# Patient Record
Sex: Female | Born: 1955 | Race: White | Hispanic: No | Marital: Married | Smoking: Former smoker
Health system: Southern US, Community
[De-identification: ages and names within clinical notes are randomized; demographics above are authoritative.]

## PROBLEM LIST (undated history)

## (undated) DIAGNOSIS — M549 Dorsalgia, unspecified: Secondary | ICD-10-CM

## (undated) HISTORY — PX: TUBAL LIGATION: SHX77

## (undated) HISTORY — PX: APPENDECTOMY: SHX54

## (undated) HISTORY — PX: OVARIAN CYST REMOVAL: SHX89

---

## 1997-04-23 HISTORY — PX: LUMBAR FUSION: SHX111

## 1997-07-28 ENCOUNTER — Ambulatory Visit (HOSPITAL_COMMUNITY): Admission: RE | Admit: 1997-07-28 | Discharge: 1997-07-28 | Payer: Self-pay | Admitting: Neurosurgery

## 1997-08-17 ENCOUNTER — Inpatient Hospital Stay (HOSPITAL_COMMUNITY): Admission: RE | Admit: 1997-08-17 | Discharge: 1997-08-21 | Payer: Self-pay | Admitting: Neurosurgery

## 1997-09-21 ENCOUNTER — Emergency Department (HOSPITAL_COMMUNITY): Admission: EM | Admit: 1997-09-21 | Discharge: 1997-09-21 | Payer: Self-pay | Admitting: Emergency Medicine

## 1998-02-02 ENCOUNTER — Encounter: Admission: RE | Admit: 1998-02-02 | Discharge: 1998-05-03 | Payer: Self-pay | Admitting: Neurosurgery

## 1998-08-27 ENCOUNTER — Encounter: Payer: Self-pay | Admitting: Emergency Medicine

## 1998-08-27 ENCOUNTER — Emergency Department (HOSPITAL_COMMUNITY): Admission: EM | Admit: 1998-08-27 | Discharge: 1998-08-27 | Payer: Self-pay | Admitting: Emergency Medicine

## 1999-03-30 ENCOUNTER — Encounter: Payer: Self-pay | Admitting: Neurosurgery

## 1999-03-30 ENCOUNTER — Ambulatory Visit (HOSPITAL_COMMUNITY): Admission: RE | Admit: 1999-03-30 | Discharge: 1999-03-30 | Payer: Self-pay | Admitting: Neurosurgery

## 1999-05-01 ENCOUNTER — Encounter: Admission: RE | Admit: 1999-05-01 | Discharge: 1999-05-01 | Payer: Self-pay | Admitting: Neurosurgery

## 1999-05-01 ENCOUNTER — Encounter: Payer: Self-pay | Admitting: Neurosurgery

## 1999-07-14 ENCOUNTER — Encounter: Payer: Self-pay | Admitting: Neurosurgery

## 1999-07-18 ENCOUNTER — Encounter: Payer: Self-pay | Admitting: Neurosurgery

## 1999-07-18 ENCOUNTER — Inpatient Hospital Stay (HOSPITAL_COMMUNITY): Admission: RE | Admit: 1999-07-18 | Discharge: 1999-07-25 | Payer: Self-pay | Admitting: Neurosurgery

## 1999-08-14 ENCOUNTER — Encounter: Admission: RE | Admit: 1999-08-14 | Discharge: 1999-08-14 | Payer: Self-pay | Admitting: Neurosurgery

## 1999-08-14 ENCOUNTER — Encounter: Payer: Self-pay | Admitting: Neurosurgery

## 1999-10-06 ENCOUNTER — Encounter: Payer: Self-pay | Admitting: Neurosurgery

## 1999-10-06 ENCOUNTER — Encounter: Admission: RE | Admit: 1999-10-06 | Discharge: 1999-10-06 | Payer: Self-pay | Admitting: Neurosurgery

## 2001-01-17 ENCOUNTER — Ambulatory Visit (HOSPITAL_COMMUNITY): Admission: RE | Admit: 2001-01-17 | Discharge: 2001-01-17 | Payer: Self-pay | Admitting: Neurosurgery

## 2001-01-17 ENCOUNTER — Encounter: Payer: Self-pay | Admitting: Neurosurgery

## 2003-11-08 ENCOUNTER — Other Ambulatory Visit: Admission: RE | Admit: 2003-11-08 | Discharge: 2003-11-08 | Payer: Self-pay | Admitting: *Deleted

## 2006-10-09 ENCOUNTER — Emergency Department (HOSPITAL_COMMUNITY): Admission: EM | Admit: 2006-10-09 | Discharge: 2006-10-09 | Payer: Self-pay | Admitting: Family Medicine

## 2006-10-09 ENCOUNTER — Ambulatory Visit (HOSPITAL_COMMUNITY): Admission: RE | Admit: 2006-10-09 | Discharge: 2006-10-09 | Payer: Self-pay | Admitting: Family Medicine

## 2007-01-31 ENCOUNTER — Ambulatory Visit (HOSPITAL_COMMUNITY): Admission: RE | Admit: 2007-01-31 | Discharge: 2007-01-31 | Payer: Self-pay | Admitting: Surgery

## 2007-01-31 ENCOUNTER — Encounter (INDEPENDENT_AMBULATORY_CARE_PROVIDER_SITE_OTHER): Payer: Self-pay | Admitting: Surgery

## 2007-03-29 ENCOUNTER — Encounter: Admission: RE | Admit: 2007-03-29 | Discharge: 2007-03-29 | Payer: Self-pay | Admitting: Neurosurgery

## 2010-05-14 ENCOUNTER — Encounter: Payer: Self-pay | Admitting: Neurosurgery

## 2010-09-05 NOTE — Op Note (Signed)
NAMEJASENIA, [name]                ACCOUNT NO.:  [id number]   MEDICAL RECORD NO.:  [id number]          PATIENT TYPE:  AMB   LOCATION:  DAY                          FACILITY:  [facility]   PHYSICIAN:  Wilmon Arms. Corliss Skains, M.D. DATE OF BIRTH:  02-05-1956   DATE OF PROCEDURE:  01/31/2007  DATE OF DISCHARGE:                               OPERATIVE REPORT   PREOPERATIVE DIAGNOSIS:  Biliary dyskinesia.   POSTOPERATIVE DIAGNOSIS:  Biliary dyskinesia.   PROCEDURE PERFORMED:  Laparoscopic cholecystectomy with intraoperative  cholangiogram.   SURGEON:  Wilmon Arms. Tsuei, M.D., FACS   ANESTHESIA:  General endotracheal.   INDICATIONS:  The patient is a 55 year old female who presents with a 7-  month history of intermittent right shoulder and right upper quadrant  pain associated with nausea and vomiting and occasional diarrhea.  These  symptoms seemed be exacerbated by eating.  An ultrasound showed no sign  of gallstones, but a small gallbladder polyp.  HIDA scan showed a  decreased gallbladder ejection fraction of only 16%.  She now presents  for elective cholecystectomy.   DESCRIPTION OF PROCEDURE:  The patient was brought to the operating  room, placed in a supine position on the operating room table.  After an  adequate level of general anesthesia was obtained, the patient's abdomen  was prepped with Betadine and draped in a sterile fashion.  A time-out  was taken to ensure the proper patient, and the proper procedure.  Her  umbilicus was infiltrated with 1/4% Marcaine.   A transverse incision was made and dissection was carried down the  fascia.  The fascia was opened vertically.  The peritoneal cavity was  bluntly entered.  A stay suture of #0 Vicryl was placed around the  fascial opening.  The Hasson cannula was inserted, and secured with a  stay suture.  A pneumoperitoneum was obtained by insufflating CO2, and  maintaining a maximum pressure of 15 mmHg.  The laparoscope was  inserted,  and the patient was rotated into reverse Trendelenburg and  tilted to her left.   A 10 mm port was placed in subxiphoid position.  Two 5 mm ports were  placed in the right upper quadrant.  The gallbladder was grasped with a  clamp and elevated over the edge of the liver.  There were some omental  adhesions to the surface of the gallbladder.  These were taken down with  scissors.  The peritoneum around the hilum of the gallbladder was  opened.  I dissected around the long, thin, cystic duct.  The cystic  duct was ligated with clips distally.  A small opening was created on  the cystic duct.  A Cook cholangiogram catheter was then inserted  through a stab incision and threaded into the cystic duct.  It was  secured with a clip.  A cholangiogram was obtained which showed a very  long, thin, cystic duct.  I had actually inserted on the left side of  the common bile duct.  The common bile duct opacified normally with no  sign of obstruction or filling defects.  Contrast flowed easily  into the  duodenum.   The catheter was then removed and the cystic duct was ligated with clips  and divided.  The cystic artery was ligated with clips and divided.  Cautery was then used to remove the gallbladder from the liver bed.  The  gallbladder was placed in an EndoCatch sac and removed out the umbilical  port site.  We reinspected the right upper quadrant for hemostasis.  There were some adhesions to the anterior surface of the liver, and  these were taken down with scissors.   The pneumoperitoneum was then released as the ports were removed under  direct vision.  Then 4-0 Vicryl was used to close the skin incisions.  Steri-Strips and clean dressings were applied.  The patient was  extubated, and brought to recovery room in stable condition.  All  sponge, instrument, and needle counts were correct.      Wilmon Arms. Tsuei, M.D.  Electronically Signed     MKT/MEDQ  D:  01/31/2007  T:  01/31/2007   Job:  [id number]   cc:   Durenda Hurt, M.D.  Fax: [phone]

## 2010-09-08 NOTE — Op Note (Signed)
. [facility]  Patient:    Laurie, Alvarado                       MRN: [id number] Proc. Date: 07/18/99 Adm. Date:  [id number] Attending:  Josie Saunders                           Operative Report  PREOPERATIVE DIAGNOSIS:  Back pain with bilateral lower extremity radiculopathy, rule out pseudoarthrosis L5-S1 (prior Ray cage fusion L5-S1).  POSTOPERATIVE DIAGNOSIS:  Back pain with bilateral lower extremity radiculopathy, rule out pseudoarthrosis L5-S1 (prior Ray cage fusion L5-S1).  Some movement demonstrated at L5-S1 level.  PROCEDURE:  Exploration of fusion L5-S1 with pedicle screw fixation at L5-S1 bilaterally with left iliac crest bone graft harvested and intertransverse bone  fusion L5-S1 bilaterally with morcellized autograft and Orthoblast.  SURGEON:  Danae Orleans. Venetia Maxon, M.D.  ASSISTANT:  Alanson Aly. Roxan Hockey, M.D.  ANESTHESIA:  General endotracheal anesthesia.  ESTIMATED BLOOD LOSS:  550 cc.  BLOOD RETURN:  200 cc of cell-saver blood returned to the patient.  COMPLICATIONS:  None.  DISPOSITION:  Recovery.  INDICATIONS:  [name] is a 55 year old woman with prior lumbar interbody fusion L5-S1, who approximately 2 years ago, who fell and developed intractable  back pain and bilateral lower extremity pain.  It was elected after she did not  improve with conservative management, including medications, a variety of physical therapy modalities to take her to surgery for exploration and fusion and possible supplemental pedicle screw fixation.  PROCEDURE:  [name] was brought to the operating room.  Following the satisfactory and noncomplicated induction of general endotracheal anesthesia and placement of intravenous lines and Foley catheter, she was placed in a prone position on the  operating table.  Her low back was prepped and draped in usual fashion.  Area of plain incision was infiltrated with 0.25% Marcaine and 0.5%  lidocaine, 1:200,000 epinephrine.  Incision was made over her previous laminectomy incision and the ld scar was removed, approximately 2 inches of adipose tissue were incised to the lumbodorsal fascia, which was then incised bilaterally in a subperiosteal fashion exposing the L4 through sacral lamina.  The transverse processes of L5 and sacral ala were then exposed.  The Versatrak self-retaining retractor was used to facilitate exposure.  It was not possible to determine whether there was adequate bony fusion of the previously operated level as the spinous process of L5 was attached and floating and it was not possible to gain adequate traction on the 5 body to determine whether there was any movement.  It was therefore elected to elp place pedicular fixation at the L5 and S1 levels and this was done under fluoroscopic guidance using SD90 pedicle screw fixation system.  A 6.75 x 40 mm  screw was placed at S1 on the left and a 45 mm screw was placed at the L5 on the left.  Using traction on the screws, it was possible to move the L5-S1 interval and it was felt that this may factor present incomplete fusion at this level. Pedicle screws were then placed at the L5 and S1 level on the right and after confirmation of positioning of pedicular fixation on lateral fluoroscopy, an A/P fluoroscopic x-ray demonstrated good positioning of the pedicular fixation.  Subsequently, it was elected to harvest iliac crest bone graft and this was done through on the eft through a separate fascial incision.  The outer table of the left iliac crest was harvested with a small window of the outer table of the iliac crest was harvested using osteotomes and then the cancellous bone graft was harvested using a variety of gouges and curets.  The hemostasis was obtained and the wound was irrigated ith bacitracin saline.  The fascia was then reapproximated with 1 Vicryl sutures. he self-retaining  retractor was again repositioned and two 40 mm rods were then lordosed somewhat and placed overlying the pedicle screws and end caps were placed. The screws were locked in situ without additional compression.  The sacral ala nd L5 transverse processes were then decorticated with Midas Rex drill and Cobb curets and morcellized bone autograft was then laid over the L5 transverse process to sacral ala bilaterally.  This was supplemented with 10 cc of Orthoblast.  The self-retaining retractor was then removed.  There was excellent hemostasis. The wound was then closed with interrupted #1 Vicryl sutures at the lumbodorsal fascia, 2-0 Vicryl interrupted, inverted sutures at the reapproximating the subcutaneous tissue, interrupted 3-0 Vicryl subcuticular stitch reapproximating the skin edges, followed by a 3-0 nylon running lock stitch.  The wound was dressed with bacitracin, Telfa gauze and tape.  Patient was extubated in the operating room nd taken to recovery room in stable satisfactory condition, having tolerated her operation well.   Counts were correct at the end of the case. DD:  07/18/99 TD:  07/19/99 Job: [id number]

## 2010-09-08 NOTE — Discharge Summary (Signed)
. [facility]  Patient:    [name], [name]                         MRN: [id number] Adm. Date:  07/18/99 Disc. Date: 07/25/99 Attending:  Danae Orleans. Venetia Maxon, M.D.                           Discharge Summary  ADMITTING DIAGNOSIS:  Prior lumbar fusion, with lumbar disk degenerative disease, and lumbar radiculopathy, with accident while at work, with back pain and bilateral lower extremity radiculopathies, with question of pseudoarthrosis at L5-S1, and level of prior fusion.  FINAL DIAGNOSIS:  Prior lumbar fusion, with lumbar disk degenerative disease, and lumbar radiculopathy, with accident while at work, with back pain and bilateral lower extremity radiculopathies, with question of pseudoarthrosis at L5-S1, and level of prior fusion.  HISTORY OF PRESENT ILLNESS/HOSPITAL COURSE:  Zanasia Hickson is a 55 year old woman who had previously undergone Ray cage fusion at L5-S1 bilaterally who fell at work developing low back pain and bilateral lower extremity radiculopathy.  It was elected after she did not improve with conservative management to take her to surgery to explore her prior fusion and to place supplemental instrumentation if there was a question of incomplete fusion. The patient was taken to surgery.  She did have a very small amount of movement at the L5-S1 level and it was therefore elected to place pedicular fixation, L5 through S1, with iliac crest autograft for intertransverse fusion.  She tolerated her surgery well, postoperatively was slow to mobilize, complained of some incisional pain, gradually mobilized although she did have some leg spasms and back pain.  She was doing better on April 3 and was discharged home in stable and satisfactory condition having tolerated the operation and hospitalization well at that time.  DISCHARGE MEDICATIONS: 1. OxyContin 30 mg p.o. b.i.d. 2. Vailum 5 mg every 6 hours as needed for spasm. 3. Supplemental  Percocet for more severe breakthrough pain, and amitriptyline    50 mg q.h.s.  DISCHARGE INSTRUCTIONS:  No lifting, bending, twisting, or driving, and keep her wound clean and dry, okay to shower.  Follow up in two to three weeks with Dr. Venetia Maxon with right-sided at that time. DD:  08/11/99 TD:  08/11/99 Job: [id number]

## 2010-09-08 NOTE — H&P (Signed)
[place]  Patient:    [name], [name]                       MRN: [id number] Adm. Date:  [id number] Attending:  Josie Saunders                         History and Physical  CHIEF COMPLAINT:  Low back pain and lumbar radiculopathy with questionable pseudoarthrosis at the prior lumbar fusion.  HISTORY OF PRESENT ILLNESS:  The patient is a 55 year old woman who was previously taken care of by Dr. Alanson Aly. Roxan Hockey, and whom I admitted to the hospital on August 17, 1997, for a lumbar disk herniation at L5-S1 with spondylosis and degenerative disk disease.  She underwent a Ray cage fusion at the L5-S1 level.  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  After that time she developed severe and intractable low back pain and lower extremity discomfort, left greater than right.  She underwent a workup of her prior fusion, including an MRI of the lumbar spine which showed cages at the L5-S1 level without evidence of any new disk herniation.  A CT scan showed the cages to be n good position with bone in the cages, and no deterioration of the end plates, which would be a suggestion of a nonunion.  She was tried for many months on conservative management including amitriptyline and narcotic analgesics, and water therapy. She did not improve, and has continued to have significant low back and bilateral lower extremity pain.  It was therefore elected to take her back to surgery to explore her previous cage fusion with possible supplemental fixation at the L5-S1 level  with pedicular fixation and possible iliac crest bone grafting.  PHYSICAL EXAMINATION:  On her neurological examination she had some mild weakness in the extensor hallucis longus on the left.  She has bilateral sciatic notch discomfort and pain in both of  her legs.  PAST MEDICAL HISTORY:  Is otherwise unremarkable.  ALLERGIES:  No known drug allergies.  CURRENT MEDICATIONS:  Amitriptyline and hydrocodone for pain.  PLAN:  For the patient to be admitted on the same-day-as-procedure basis on July 18, 1999, with an exploration of the Ray cage fusion at the L5-S1 level, with possible supplemental pedicle fixation, including iliac crest bone grafting. We discussed the potential risks of surgery, as well as the potential benefits. She wished to go ahead with surgery, and this is set up for July 18, 1999. DD:  07/18/99 TD:  07/18/99 Job: [id number]

## 2011-02-01 LAB — COMPREHENSIVE METABOLIC PANEL
ALT: 8
AST: 20
Alkaline Phosphatase: 59
BUN: 10
CO2: 28
GFR calc Af Amer: >60
GFR calc non Af Amer: >60
Glucose, Bld: 101 — ABNORMAL HIGH
Potassium: 5.2 — ABNORMAL HIGH
Total Bilirubin: 0.4

## 2011-02-01 LAB — CBC
HCT: 42.5
MCHC: 34.5
MCV: 89.5
Platelets: 343
RDW: 14.2 — ABNORMAL HIGH
WBC: 8.3

## 2011-02-01 LAB — DIFFERENTIAL
Basophils Relative: 1
Lymphocytes Relative: 24
Lymphs Abs: 2
Neutro Abs: 5.7

## 2011-02-07 LAB — HEPATIC FUNCTION PANEL
ALT: 9
AST: 17
Albumin: 4.1
Alkaline Phosphatase: 66
Bilirubin, Direct: <0.1
Total Bilirubin: 0.7
Total Protein: 7.5

## 2011-02-07 LAB — AMYLASE: Amylase: 32

## 2011-02-07 LAB — CBC
HCT: 42.4
Hemoglobin: 14.4
MCHC: 34
MCV: 87.8
Platelets: 380
RBC: 4.83
RDW: 14.8 — ABNORMAL HIGH
WBC: 7.8

## 2011-02-07 LAB — DIFFERENTIAL
Basophils Absolute: 0
Basophils Relative: 0
Eosinophils Absolute: 0.1
Eosinophils Relative: 1
Lymphocytes Relative: 25
Lymphs Abs: 2
Monocytes Absolute: 0.5
Monocytes Relative: 7
Neutro Abs: 5.2
Neutrophils Relative %: 67

## 2011-02-07 LAB — POCT URINALYSIS DIP (DEVICE)
Bilirubin Urine: NEGATIVE
Glucose, UA: NEGATIVE
Ketones, ur: NEGATIVE
Nitrite: NEGATIVE
Specific Gravity, Urine: 1.01
pH: 6

## 2011-02-07 LAB — LIPASE, BLOOD: Lipase: 22

## 2018-05-07 ENCOUNTER — Other Ambulatory Visit: Payer: Self-pay | Admitting: Neurosurgery

## 2018-05-07 DIAGNOSIS — M5416 Radiculopathy, lumbar region: Secondary | ICD-10-CM

## 2018-05-15 ENCOUNTER — Ambulatory Visit
Admission: RE | Admit: 2018-05-15 | Discharge: 2018-05-15 | Disposition: A | Discharge status: Home / Self Care | Payer: 59 | Source: Ambulatory Visit | Attending: Neurosurgery | Admitting: Neurosurgery

## 2018-05-15 DIAGNOSIS — M5416 Radiculopathy, lumbar region: Secondary | ICD-10-CM

## 2018-05-15 MED ORDER — GADOBENATE DIMEGLUMINE 529 MG/ML IV SOLN
14.0000 mL | Freq: Once | INTRAVENOUS | Status: AC | PRN
  Administered 2018-05-15: 14 mL via INTRAVENOUS

## 2018-06-10 ENCOUNTER — Other Ambulatory Visit: Payer: Self-pay | Admitting: Neurosurgery

## 2018-06-28 NOTE — H&P (Signed)
Patient ID:   [id number] Patient: [name]  Date of Birth: 12-23-1955 Visit Type: Office Visit   Date: 06/02/2018 02:45 PM Provider: Marchia Meiers. Vertell Limber MD   This 63 year old female presents for back pain.  HISTORY OF PRESENT ILLNESS:  1.  back pain  Patient returns to review her MRI. She endorses worsening bilateral lower extremity pain and low back pain. She rates her current pain at 7-8/10 and notes that her bilateral lower extremity pain is worse than her low back pain. Pain is not reduced by sitting.  05/15/18 L-spine MRI with and without contrast 1. Status post L5-S1 PLIF, worsening L4-5 adjacent segment disease including grade 2 L4-5 anterolisthesis. 2. Moderate to severe canal stenosis at L4-5. Moderate to severe left L4-5 neural foraminal narrowing. 3. No acute osseous process.         Medical/Surgical/Interim History Reviewed, no change.  Last detailed document date:11/26/2016.     Family History:  Reviewed, no changes.  Last detailed document date:11/26/2016.   Social History: Reviewed, no changes. Last detailed document date: 11/26/2016.    MEDICATIONS: (added, continued or stopped this visit) Started Medication Directions Instruction Stopped  04/14/2014 cyclobenzaprine 10 mg tablet take 1 po up to bid prn spasms    02/17/2018 gabapentin 300 mg capsule TAKE 1 CAPSULE BY MOUTH THREE TIMES A DAY    09/29/2013 hydrocodone 10 mg-acetaminophen 325 mg tablet take 1 tablet by oral route BID as needed for pain       ALLERGIES: Ingredient Reaction Medication Name Comment  NO KNOWN ALLERGIES     No known allergies.    PHYSICAL EXAM:   Vitals Date Temp F BP Pulse Ht In Wt Lb BMI BSA Pain Score  06/02/2018  126/85 78 62 163 29.81  6/10      IMPRESSION:   4-view L-spine X-rays reveal well healed L5-S1 PLIF, spondylolisthesis of L4 on L5 with measurements of : 15 mm in neutral view, 13 mm upon extension, and 15.5 mm upon flexion. Some movement occurs in  the L3-4 level.  L-spine MRI without contrast reveals well-healed PLIF at L5-S1, worsening adjacent segment disease at L4-5 with moderate to severe central stenosis and moderate to severe left neuroforaminal narrowing at this level. No significant cause for concern noted at any other disc level within the lumbar spine. Advised patient that injections may not provide significant relief of her symptoms. Recommended surgical intervention. Recommended exploration of L5-S1 fusion with either L4-5 TLIF vs PLIF. Advised patient that she would likely remain out of work 4-6 weeks after surgery. Encouraged weight loss and advised patient that her weight is contributing to the degeneration within her lumbar spine. Patient endorses that she would like some time to consider her options before deciding will call to schedule surgery if she decides that she would like to proceed with surgery.   PLAN:  1. Patient will call to schedule surgery in the future if she decides to proceed with surgery  Orders: Instruction(s)/Education: Assessment Instruction  R03.0 Hypertension education  Z68.29 Lifestyle education regarding diet   Completed Orders (this encounter) Order Details Reason Side Interpretation Result Initial Treatment Date Region  Hypertension education Continue to monitor blood pressure, if blood pressure remains elevated contact primary doctor        Lifestyle education regarding diet Patient encouraged to eat a well balance diet         Assessment/Plan   # Detail Type Description   1. Assessment Low back pain (M54.5).  2. Assessment Elevated blood-pressure reading, w/o diagnosis of htn (R03.0).       3. Assessment Body mass index (BMI) 29.0-29.9, adult (F74.94).   Plan Orders Today's instructions / counseling include(s) Lifestyle education regarding diet. Clinical information/comments: Patient encouraged to eat a well balance diet.         Pain Management Plan Pain Scale: 6/10. Method:  Numeric Pain Intensity Scale. Location: back. Onset: 11/27/1990. Duration: varies. Quality: discomforting. Pain management follow-up plan of care: Patient taking medication as prescribed.  Fall Risk Plan The patient has not fallen in the last year.              Provider:  Marchia Meiers. Vertell Limber MD  06/03/2018 09:05 PM Dictation edited by: Mirian Mo    CC Providers: [facility] [employer],  [place]  [phone]   [place]  [address] [place], [place] [address]               Electronically signed by Marchia Meiers. Vertell Limber MD on 06/04/2018 07:15 AM  Patient ID:   [id number] Patient: [name]  Date of Birth: 1956-03-18 Visit Type: Office Visit   Date: 04/21/2018 03:15 PM Provider: Marchia Meiers. Vertell Limber MD   This 63 year old female presents for back pain.  HISTORY OF PRESENT ILLNESS:  1.  back pain  Patient returns as scheduled. Patient reports worsening bilateral lower extremity pain and wishes to further investigate this pain.  300 mg gabapentin TID reduces pain         Medical/Surgical/Interim History Reviewed, no change.  Last detailed document date:11/26/2016.     Family History:  Reviewed, no changes.  Last detailed document date:11/26/2016.   Social History: Reviewed, no changes. Last detailed document date: 11/26/2016.    MEDICATIONS: (added, continued or stopped this visit) Started Medication Directions Instruction Stopped  04/14/2014 cyclobenzaprine 10 mg tablet take 1 po up to bid prn spasms    02/17/2018 gabapentin 300 mg capsule TAKE 1 CAPSULE BY MOUTH THREE TIMES A DAY    09/29/2013 hydrocodone 10 mg-acetaminophen 325 mg tablet take 1 tablet by oral route BID as needed for pain       ALLERGIES: Ingredient Reaction Medication Name Comment  NO KNOWN ALLERGIES     No known allergies.    PHYSICAL EXAM:   Vitals Date Temp F BP Pulse Ht In Wt Lb BMI BSA  Pain Score  04/21/2018  134/84 78 62 164 30  5/10      IMPRESSION:   Upon examination, positive SLR bilaterally, L5 distribution of pain into BLE strength full in BLE.  Patient's symptoms have worsened. Recommended imaging - 4-view L-spine X-rays ordered and L-spine MRI with and without contrast scheduled. Patient will return to review images after imaging is completed.  PLAN:  1. L-spine MRI with and without contrast scheduled 2. 4-view L-spine X-rays ordered 3. Follow-up after imaging is complete               Provider:  Marchia Meiers. Vertell Limber MD  04/21/2018 10:17 PM Dictation edited by: Mirian Mo    CC Providers: [facility] [place],  [place]  [phone]   Erline Levine MD  [address] [place], [place] [address]               Electronically signed by Marchia Meiers. Vertell Limber MD on 04/23/2018 04:11 PM

## 2018-07-04 ENCOUNTER — Encounter (HOSPITAL_COMMUNITY): Payer: Self-pay

## 2018-07-04 ENCOUNTER — Other Ambulatory Visit: Payer: Self-pay

## 2018-07-04 ENCOUNTER — Encounter (HOSPITAL_COMMUNITY)
Admission: RE | Admit: 2018-07-04 | Discharge: 2018-07-04 | Disposition: A | Discharge status: Home / Self Care | Payer: 59 | Source: Ambulatory Visit | Attending: Neurosurgery | Admitting: Neurosurgery

## 2018-07-04 DIAGNOSIS — Z01812 Encounter for preprocedural laboratory examination: Secondary | ICD-10-CM | POA: Insufficient documentation

## 2018-07-04 LAB — BASIC METABOLIC PANEL
Anion gap: 13 (ref 5–15)
BUN: 12 mg/dL (ref 6–20)
CALCIUM: 10.4 mg/dL — AB (ref 8.9–10.3)
CO2: 24 mmol/L (ref 22–32)
Chloride: 102 mmol/L (ref 98–111)
Creatinine, Ser: 0.79 mg/dL (ref 0.44–1.00)
GFR calc non Af Amer: >60 mL/min (ref >60)
Glucose, Bld: 99 mg/dL (ref 70–99)
Potassium: 3.7 mmol/L (ref 3.5–5.1)
Sodium: 139 mmol/L (ref 135–145)

## 2018-07-04 LAB — CBC
HCT: 45.4 % (ref 36.0–46.0)
Hemoglobin: 14.5 g/dL (ref 12.0–15.0)
MCH: 30.5 pg (ref 26.0–34.0)
MCHC: 31.9 g/dL (ref 30.0–36.0)
MCV: 95.4 fL (ref 80.0–100.0)
PLATELETS: 341 10*3/uL (ref 150–400)
RBC: 4.76 MIL/uL (ref 3.87–5.11)
RDW: 13.4 % (ref 11.5–15.5)
WBC: 8.2 10*3/uL (ref 4.0–10.5)
nRBC: 0 % (ref 0.0–0.2)

## 2018-07-04 LAB — TYPE AND SCREEN
ABO/RH(D): O POS
Antibody Screen: NEGATIVE

## 2018-07-04 LAB — ABO/RH: ABO/RH(D): O POS

## 2018-07-04 LAB — SURGICAL PCR SCREEN
MRSA, PCR: NEGATIVE
Staphylococcus aureus: NEGATIVE

## 2018-07-04 NOTE — Progress Notes (Signed)
PCP - Tamsen Roers Cardiologist - denies  Chest x-ray - N/A EKG - N/A  DM - denies SA - denies  Anesthesia review: N/A  Patient denies shortness of breath, fever, cough and chest pain at PAT appointment   Patient verbalized understanding of instructions that were given to them at the PAT appointment. Patient was also instructed that they will need to review over the PAT instructions again at home before surgery.

## 2018-07-04 NOTE — Pre-Procedure Instructions (Signed)
[name]  07/04/2018      [id number] [address], [place] [address] Phone: [phone] Fax: [phone]    Your procedure is scheduled on 07/11/18.  Report to [facility] Admitting at 530 A.M.  Call this number if you have problems the morning of surgery:  [phone]   Remember:  Do not eat or drink after midnight.     Take these medicines the morning of surgery with A SIP OF WATER -----tylenol,neurontin    Do not wear jewelry, make-up or nail polish.  Do not wear lotions, powders, or perfumes, or deodorant.  Do not shave 48 hours prior to surgery.  Men may shave face and neck.  Do not bring valuables to the hospital.  [facility] is not responsible for any belongings or valuables.  Contacts, dentures or bridgework may not be worn into surgery.  Leave your suitcase in the car.  After surgery it may be brought to your room.  For patients admitted to the hospital, discharge time will be determined by your treatment team.  Patients discharged the day of surgery will not be allowed to drive home.   Name and phone number of your driver:   Do not take any aspirin,anti-inflammatories,vitamins,or herbal supplements 5-7 days prior to surgery. Special instructions:  [place] - Preparing for Surgery  Before surgery, you can play an important role.  Because skin is not sterile, your skin needs to be as free of germs as possible.  You can reduce the number of germs on you skin by washing with CHG (chlorahexidine gluconate) soap before surgery.  CHG is an antiseptic cleaner which kills germs and bonds with the skin to continue killing germs even after washing.  Oral Hygiene is also important in reducing the risk of infection.  Remember to brush your teeth with your regular toothpaste the morning of surgery.  Please DO NOT use if you have an allergy to CHG or antibacterial soaps.  If your skin becomes  reddened/irritated stop using the CHG and inform your nurse when you arrive at Short Stay.  Do not shave (including legs and underarms) for at least 48 hours prior to the first CHG shower.  You may shave your face.  Please follow these instructions carefully:   1.  Shower with CHG Soap the night before surgery and the morning of Surgery.  2.  If you choose to wash your hair, wash your hair first as usual with your normal shampoo.  3.  After you shampoo, rinse your hair and body thoroughly to remove the shampoo. 4.  Use CHG as you would any other liquid soap.  You can apply chg directly to the skin and wash gently with a      scrungie or washcloth.           5.  Apply the CHG Soap to your body ONLY FROM THE NECK DOWN.   Do not use on open wounds or open sores. Avoid contact with your eyes, ears, mouth and genitals (private parts).  Wash genitals (private parts) with your normal soap.  6.  Wash thoroughly, paying special attention to the area where your surgery will be performed.  7.  Thoroughly rinse your body with warm water from the neck down.  8.  DO NOT shower/wash with your normal soap after using and rinsing off the CHG Soap.  9.  Pat yourself dry with a clean towel.  10.  Wear clean pajamas.            11.  Place clean sheets on your bed the night of your first shower and do not sleep with pets.  Day of Surgery  Do not apply any lotions/deoderants the morning of surgery.   Please wear clean clothes to the hospital/surgery center. Remember to brush your teeth with toothpaste.    Please read over the following fact sheets that you were given. MRSA Information

## 2018-07-10 MED ORDER — CEFAZOLIN SODIUM-DEXTROSE 2-4 GM/100ML-% IV SOLN
2.0000 g | INTRAVENOUS | Status: AC
  Administered 2018-07-11: 2 g via INTRAVENOUS
  Filled 2018-07-10: qty 100

## 2018-07-11 ENCOUNTER — Inpatient Hospital Stay (HOSPITAL_COMMUNITY): Payer: 59

## 2018-07-11 ENCOUNTER — Inpatient Hospital Stay (HOSPITAL_COMMUNITY)
Admission: RE | Admit: 2018-07-11 | Discharge: 2018-07-13 | DRG: 455 | Disposition: A | Discharge status: Home / Self Care | Payer: 59 | Attending: Neurosurgery | Admitting: Neurosurgery

## 2018-07-11 ENCOUNTER — Inpatient Hospital Stay (HOSPITAL_COMMUNITY): Payer: 59 | Admitting: Vascular Surgery

## 2018-07-11 ENCOUNTER — Other Ambulatory Visit: Payer: Self-pay

## 2018-07-11 ENCOUNTER — Encounter (HOSPITAL_COMMUNITY): Payer: Self-pay

## 2018-07-11 ENCOUNTER — Encounter (HOSPITAL_COMMUNITY)
Admission: RE | Disposition: A | Discharge status: Home / Self Care | Payer: Self-pay | Source: Home / Self Care | Attending: Neurosurgery

## 2018-07-11 DIAGNOSIS — M48061 Spinal stenosis, lumbar region without neurogenic claudication: Secondary | ICD-10-CM | POA: Diagnosis present

## 2018-07-11 DIAGNOSIS — M4316 Spondylolisthesis, lumbar region: Secondary | ICD-10-CM | POA: Diagnosis present

## 2018-07-11 DIAGNOSIS — Z87891 Personal history of nicotine dependence: Secondary | ICD-10-CM

## 2018-07-11 DIAGNOSIS — M7138 Other bursal cyst, other site: Secondary | ICD-10-CM | POA: Diagnosis present

## 2018-07-11 DIAGNOSIS — M4310 Spondylolisthesis, site unspecified: Secondary | ICD-10-CM

## 2018-07-11 DIAGNOSIS — M545 Low back pain: Secondary | ICD-10-CM | POA: Diagnosis present

## 2018-07-11 DIAGNOSIS — M5416 Radiculopathy, lumbar region: Secondary | ICD-10-CM | POA: Diagnosis present

## 2018-07-11 SURGERY — POSTERIOR LUMBAR FUSION 1 LEVEL
Anesthesia: General | Site: Back

## 2018-07-11 MED ORDER — PHENYLEPHRINE 40 MCG/ML (10ML) SYRINGE FOR IV PUSH (FOR BLOOD PRESSURE SUPPORT)
PREFILLED_SYRINGE | INTRAVENOUS | Status: AC
  Filled 2018-07-11: qty 10

## 2018-07-11 MED ORDER — DEXAMETHASONE SODIUM PHOSPHATE 10 MG/ML IJ SOLN
INTRAMUSCULAR | Status: AC
  Filled 2018-07-11: qty 1

## 2018-07-11 MED ORDER — EPHEDRINE SULFATE-NACL 50-0.9 MG/10ML-% IV SOSY
PREFILLED_SYRINGE | INTRAVENOUS | Status: DC | PRN
  Administered 2018-07-11: 10 mg via INTRAVENOUS
  Administered 2018-07-11 (×2): 5 mg via INTRAVENOUS
  Administered 2018-07-11: 10 mg via INTRAVENOUS
  Administered 2018-07-11: 5 mg via INTRAVENOUS

## 2018-07-11 MED ORDER — ACETAMINOPHEN 10 MG/ML IV SOLN
1000.0000 mg | Freq: Once | INTRAVENOUS | Status: DC | PRN
  Administered 2018-07-11: 1000 mg via INTRAVENOUS

## 2018-07-11 MED ORDER — DEXAMETHASONE SODIUM PHOSPHATE 10 MG/ML IJ SOLN
INTRAMUSCULAR | Status: DC | PRN
  Administered 2018-07-11: 10 mg via INTRAVENOUS

## 2018-07-11 MED ORDER — THROMBIN 5000 UNITS EX SOLR
CUTANEOUS | Status: AC
  Filled 2018-07-11: qty 5000

## 2018-07-11 MED ORDER — ACETAMINOPHEN 160 MG/5ML PO SOLN: 325.0000 mg | Freq: Once | ORAL | Status: DC

## 2018-07-11 MED ORDER — LIDOCAINE 2% (20 MG/ML) 5 ML SYRINGE
INTRAMUSCULAR | Status: AC
  Filled 2018-07-11: qty 5

## 2018-07-11 MED ORDER — SODIUM CHLORIDE 0.9% FLUSH
3.0000 mL | Freq: Two times a day (BID) | INTRAVENOUS | Status: DC
  Administered 2018-07-11 – 2018-07-13 (×4): 3 mL via INTRAVENOUS

## 2018-07-11 MED ORDER — CHLORHEXIDINE GLUCONATE CLOTH 2 % EX PADS: 6.0000 | MEDICATED_PAD | Freq: Once | CUTANEOUS | Status: DC

## 2018-07-11 MED ORDER — THROMBIN 20000 UNITS EX SOLR
CUTANEOUS | Status: AC
  Filled 2018-07-11: qty 20000

## 2018-07-11 MED ORDER — BUPIVACAINE HCL (PF) 0.5 % IJ SOLN
INTRAMUSCULAR | Status: DC | PRN
  Administered 2018-07-11: 10 mL

## 2018-07-11 MED ORDER — KCL IN DEXTROSE-NACL 20-5-0.45 MEQ/L-%-% IV SOLN: INTRAVENOUS | Status: DC

## 2018-07-11 MED ORDER — THROMBIN 5000 UNITS EX SOLR
OROMUCOSAL | Status: DC | PRN
  Administered 2018-07-11: 08:00:00 via TOPICAL

## 2018-07-11 MED ORDER — FENTANYL CITRATE (PF) 100 MCG/2ML IJ SOLN
INTRAMUSCULAR | Status: DC | PRN
  Administered 2018-07-11: 75 ug via INTRAVENOUS
  Administered 2018-07-11 (×2): 25 ug via INTRAVENOUS
  Administered 2018-07-11: 50 ug via INTRAVENOUS
  Administered 2018-07-11: 25 ug via INTRAVENOUS

## 2018-07-11 MED ORDER — LIDOCAINE-EPINEPHRINE 1 %-1:100000 IJ SOLN
INTRAMUSCULAR | Status: DC | PRN
  Administered 2018-07-11: 10 mL

## 2018-07-11 MED ORDER — ACETAMINOPHEN 325 MG PO TABS: 325.0000 mg | ORAL_TABLET | Freq: Once | ORAL | Status: DC

## 2018-07-11 MED ORDER — ACETAMINOPHEN 325 MG PO TABS: 650.0000 mg | ORAL_TABLET | ORAL | Status: DC | PRN

## 2018-07-11 MED ORDER — SODIUM CHLORIDE 0.9 % IV SOLN: 250.0000 mL | INTRAVENOUS | Status: DC

## 2018-07-11 MED ORDER — MIDAZOLAM HCL 5 MG/5ML IJ SOLN
INTRAMUSCULAR | Status: DC | PRN
  Administered 2018-07-11: 2 mg via INTRAVENOUS

## 2018-07-11 MED ORDER — OXYCODONE HCL 5 MG PO TABS
5.0000 mg | ORAL_TABLET | ORAL | Status: DC | PRN
  Administered 2018-07-12 – 2018-07-13 (×3): 5 mg via ORAL
  Filled 2018-07-11 (×4): qty 1

## 2018-07-11 MED ORDER — METHOCARBAMOL 1000 MG/10ML IJ SOLN
500.0000 mg | Freq: Four times a day (QID) | INTRAVENOUS | Status: DC | PRN
  Filled 2018-07-11: qty 5

## 2018-07-11 MED ORDER — SCOPOLAMINE 1 MG/3DAYS TD PT72
MEDICATED_PATCH | TRANSDERMAL | Status: DC | PRN
  Administered 2018-07-11: 1 via TRANSDERMAL

## 2018-07-11 MED ORDER — SODIUM CHLORIDE 0.9 % IV SOLN
INTRAVENOUS | Status: DC | PRN
  Administered 2018-07-11: 40 ug/min via INTRAVENOUS
  Administered 2018-07-11: 80 ug/min via INTRAVENOUS

## 2018-07-11 MED ORDER — LACTATED RINGERS IV SOLN: INTRAVENOUS | Status: DC

## 2018-07-11 MED ORDER — THROMBIN 20000 UNITS EX SOLR
OROMUCOSAL | Status: DC | PRN
  Administered 2018-07-11: 08:00:00 via TOPICAL

## 2018-07-11 MED ORDER — MORPHINE SULFATE (PF) 2 MG/ML IV SOLN
2.0000 mg | INTRAVENOUS | Status: DC | PRN
  Administered 2018-07-11: 2 mg via INTRAVENOUS

## 2018-07-11 MED ORDER — BISACODYL 10 MG RE SUPP: 10.0000 mg | Freq: Every day | RECTAL | Status: DC | PRN

## 2018-07-11 MED ORDER — HYDROMORPHONE HCL 1 MG/ML IJ SOLN
INTRAMUSCULAR | Status: AC
  Filled 2018-07-11: qty 1

## 2018-07-11 MED ORDER — GLYCOPYRROLATE 0.2 MG/ML IJ SOLN
INTRAMUSCULAR | Status: DC | PRN
  Administered 2018-07-11 (×2): .1 mg via INTRAVENOUS

## 2018-07-11 MED ORDER — EPHEDRINE 5 MG/ML INJ
INTRAVENOUS | Status: AC
  Filled 2018-07-11: qty 10

## 2018-07-11 MED ORDER — DOCUSATE SODIUM 100 MG PO CAPS
100.0000 mg | ORAL_CAPSULE | Freq: Two times a day (BID) | ORAL | Status: DC
  Administered 2018-07-11 – 2018-07-13 (×5): 100 mg via ORAL
  Filled 2018-07-11 (×5): qty 1

## 2018-07-11 MED ORDER — BUPIVACAINE LIPOSOME 1.3 % IJ SUSP: INTRAMUSCULAR | Status: DC | PRN

## 2018-07-11 MED ORDER — BUPIVACAINE HCL (PF) 0.5 % IJ SOLN
INTRAMUSCULAR | Status: AC
  Filled 2018-07-11: qty 30

## 2018-07-11 MED ORDER — POLYETHYLENE GLYCOL 3350 17 G PO PACK: 17.0000 g | PACK | Freq: Every day | ORAL | Status: DC | PRN

## 2018-07-11 MED ORDER — ACETAMINOPHEN 650 MG RE SUPP: 650.0000 mg | RECTAL | Status: DC | PRN

## 2018-07-11 MED ORDER — ACETAMINOPHEN 10 MG/ML IV SOLN
INTRAVENOUS | Status: AC
  Filled 2018-07-11: qty 100

## 2018-07-11 MED ORDER — PROPOFOL 10 MG/ML IV BOLUS
INTRAVENOUS | Status: AC
  Filled 2018-07-11: qty 20

## 2018-07-11 MED ORDER — 0.9 % SODIUM CHLORIDE (POUR BTL) OPTIME
TOPICAL | Status: DC | PRN
  Administered 2018-07-11: 1000 mL

## 2018-07-11 MED ORDER — ONDANSETRON HCL 4 MG/2ML IJ SOLN: 4.0000 mg | Freq: Four times a day (QID) | INTRAMUSCULAR | Status: DC | PRN

## 2018-07-11 MED ORDER — LIDOCAINE 2% (20 MG/ML) 5 ML SYRINGE
INTRAMUSCULAR | Status: DC | PRN
  Administered 2018-07-11: 40 mg via INTRAVENOUS

## 2018-07-11 MED ORDER — SODIUM CHLORIDE 0.9% FLUSH: 3.0000 mL | INTRAVENOUS | Status: DC | PRN

## 2018-07-11 MED ORDER — ONDANSETRON HCL 4 MG/2ML IJ SOLN
INTRAMUSCULAR | Status: DC | PRN
  Administered 2018-07-11: 4 mg via INTRAVENOUS

## 2018-07-11 MED ORDER — FLEET ENEMA 7-19 GM/118ML RE ENEM: 1.0000 | ENEMA | Freq: Once | RECTAL | Status: DC | PRN

## 2018-07-11 MED ORDER — CEFAZOLIN SODIUM-DEXTROSE 2-4 GM/100ML-% IV SOLN
2.0000 g | Freq: Three times a day (TID) | INTRAVENOUS | Status: AC
  Administered 2018-07-11 (×2): 2 g via INTRAVENOUS
  Filled 2018-07-11 (×2): qty 100

## 2018-07-11 MED ORDER — MORPHINE SULFATE (PF) 2 MG/ML IV SOLN
INTRAVENOUS | Status: AC
  Filled 2018-07-11: qty 1

## 2018-07-11 MED ORDER — MIDAZOLAM HCL 2 MG/2ML IJ SOLN
INTRAMUSCULAR | Status: AC
  Filled 2018-07-11: qty 2

## 2018-07-11 MED ORDER — PROPOFOL 10 MG/ML IV BOLUS
INTRAVENOUS | Status: DC | PRN
  Administered 2018-07-11: 50 mg via INTRAVENOUS
  Administered 2018-07-11: 150 mg via INTRAVENOUS

## 2018-07-11 MED ORDER — ROCURONIUM BROMIDE 50 MG/5ML IV SOSY
PREFILLED_SYRINGE | INTRAVENOUS | Status: AC
  Filled 2018-07-11: qty 5

## 2018-07-11 MED ORDER — GABAPENTIN 300 MG PO CAPS
300.0000 mg | ORAL_CAPSULE | Freq: Three times a day (TID) | ORAL | Status: DC
  Administered 2018-07-11 – 2018-07-13 (×6): 300 mg via ORAL
  Filled 2018-07-11 (×6): qty 1

## 2018-07-11 MED ORDER — SUGAMMADEX SODIUM 200 MG/2ML IV SOLN
INTRAVENOUS | Status: DC | PRN
  Administered 2018-07-11: 150 mg via INTRAVENOUS

## 2018-07-11 MED ORDER — FENTANYL CITRATE (PF) 250 MCG/5ML IJ SOLN
INTRAMUSCULAR | Status: AC
  Filled 2018-07-11: qty 5

## 2018-07-11 MED ORDER — METHOCARBAMOL 500 MG PO TABS
ORAL_TABLET | ORAL | Status: AC
  Administered 2018-07-11: 500 mg via ORAL
  Filled 2018-07-11: qty 1

## 2018-07-11 MED ORDER — MENTHOL 3 MG MT LOZG: 1.0000 | LOZENGE | OROMUCOSAL | Status: DC | PRN

## 2018-07-11 MED ORDER — LACTATED RINGERS IV SOLN
INTRAVENOUS | Status: DC | PRN
  Administered 2018-07-11 (×2): via INTRAVENOUS

## 2018-07-11 MED ORDER — HYDROMORPHONE HCL 1 MG/ML IJ SOLN
0.2500 mg | INTRAMUSCULAR | Status: DC | PRN
  Administered 2018-07-11 (×4): 0.5 mg via INTRAVENOUS

## 2018-07-11 MED ORDER — MEPERIDINE HCL 50 MG/ML IJ SOLN: 6.2500 mg | INTRAMUSCULAR | Status: DC | PRN

## 2018-07-11 MED ORDER — PANTOPRAZOLE SODIUM 40 MG IV SOLR
40.0000 mg | Freq: Every day | INTRAVENOUS | Status: DC
  Administered 2018-07-11 – 2018-07-12 (×2): 40 mg via INTRAVENOUS
  Filled 2018-07-11 (×2): qty 40

## 2018-07-11 MED ORDER — ONDANSETRON HCL 4 MG/2ML IJ SOLN
INTRAMUSCULAR | Status: AC
  Filled 2018-07-11: qty 2

## 2018-07-11 MED ORDER — ONDANSETRON HCL 4 MG PO TABS: 4.0000 mg | ORAL_TABLET | Freq: Four times a day (QID) | ORAL | Status: DC | PRN

## 2018-07-11 MED ORDER — ZOLPIDEM TARTRATE 5 MG PO TABS: 5.0000 mg | ORAL_TABLET | Freq: Every evening | ORAL | Status: DC | PRN

## 2018-07-11 MED ORDER — SUCCINYLCHOLINE CHLORIDE 200 MG/10ML IV SOSY
PREFILLED_SYRINGE | INTRAVENOUS | Status: AC
  Filled 2018-07-11: qty 10

## 2018-07-11 MED ORDER — BUPIVACAINE LIPOSOME 1.3 % IJ SUSP
20.0000 mL | INTRAMUSCULAR | Status: AC
  Administered 2018-07-11: 20 mL
  Filled 2018-07-11: qty 20

## 2018-07-11 MED ORDER — ROCURONIUM BROMIDE 50 MG/5ML IV SOSY
PREFILLED_SYRINGE | INTRAVENOUS | Status: DC | PRN
  Administered 2018-07-11: 10 mg via INTRAVENOUS
  Administered 2018-07-11: 20 mg via INTRAVENOUS
  Administered 2018-07-11: 10 mg via INTRAVENOUS
  Administered 2018-07-11: 50 mg via INTRAVENOUS

## 2018-07-11 MED ORDER — PHENYLEPHRINE 40 MCG/ML (10ML) SYRINGE FOR IV PUSH (FOR BLOOD PRESSURE SUPPORT)
PREFILLED_SYRINGE | INTRAVENOUS | Status: DC | PRN
  Administered 2018-07-11: 40 ug via INTRAVENOUS
  Administered 2018-07-11 (×3): 120 ug via INTRAVENOUS
  Administered 2018-07-11: 160 ug via INTRAVENOUS
  Administered 2018-07-11: 40 ug via INTRAVENOUS
  Administered 2018-07-11: 200 ug via INTRAVENOUS

## 2018-07-11 MED ORDER — PHENOL 1.4 % MT LIQD: 1.0000 | OROMUCOSAL | Status: DC | PRN

## 2018-07-11 MED ORDER — SUCCINYLCHOLINE CHLORIDE 20 MG/ML IJ SOLN
INTRAMUSCULAR | Status: DC | PRN
  Administered 2018-07-11: 140 mg via INTRAVENOUS

## 2018-07-11 MED ORDER — GLYCOPYRROLATE PF 0.2 MG/ML IJ SOSY
PREFILLED_SYRINGE | INTRAMUSCULAR | Status: AC
  Filled 2018-07-11: qty 1

## 2018-07-11 MED ORDER — HYDROCODONE-ACETAMINOPHEN 5-325 MG PO TABS
2.0000 | ORAL_TABLET | ORAL | Status: DC | PRN
  Administered 2018-07-11 – 2018-07-13 (×12): 2 via ORAL
  Filled 2018-07-11 (×12): qty 2

## 2018-07-11 MED ORDER — ALUM & MAG HYDROXIDE-SIMETH 200-200-20 MG/5ML PO SUSP: 30.0000 mL | Freq: Four times a day (QID) | ORAL | Status: DC | PRN

## 2018-07-11 MED ORDER — PROMETHAZINE HCL 25 MG/ML IJ SOLN: 6.2500 mg | INTRAMUSCULAR | Status: DC | PRN

## 2018-07-11 MED ORDER — ALBUMIN HUMAN 5 % IV SOLN
INTRAVENOUS | Status: DC | PRN
  Administered 2018-07-11 (×2): via INTRAVENOUS

## 2018-07-11 MED ORDER — ACETAMINOPHEN 325 MG PO TABS: 650.0000 mg | ORAL_TABLET | Freq: Two times a day (BID) | ORAL | Status: DC | PRN

## 2018-07-11 MED ORDER — METHOCARBAMOL 500 MG PO TABS
500.0000 mg | ORAL_TABLET | Freq: Four times a day (QID) | ORAL | Status: DC | PRN
  Administered 2018-07-11 – 2018-07-13 (×8): 500 mg via ORAL
  Filled 2018-07-11 (×7): qty 1

## 2018-07-11 MED ORDER — LIDOCAINE-EPINEPHRINE 1 %-1:100000 IJ SOLN
INTRAMUSCULAR | Status: AC
  Filled 2018-07-11: qty 1

## 2018-07-11 SURGICAL SUPPLY — 78 items
ADH SKN CLS APL DERMABOND .7 (GAUZE/BANDAGES/DRESSINGS) ×1
BASKET BONE COLLECTION (BASKET) ×3 IMPLANT
BLADE CLIPPER SURG (BLADE) IMPLANT
BONE CANC CHIPS 20CC PCAN1/4 (Bone Implant) ×3 IMPLANT
BUR MATCHSTICK NEURO 3.0 LAGG (BURR) ×3 IMPLANT
BUR PRECISION FLUTE 5.0 (BURR) ×3 IMPLANT
CAGE COROENT LG 10X9X23-12 (Cage) ×4 IMPLANT
CANISTER SUCT 3000ML PPV (MISCELLANEOUS) ×3 IMPLANT
CARTRIDGE OIL MAESTRO DRILL (MISCELLANEOUS) ×1 IMPLANT
CHIPS CANC BONE 20CC PCAN1/4 (Bone Implant) ×1 IMPLANT
CONT SPEC 4OZ CLIKSEAL STRL BL (MISCELLANEOUS) ×3 IMPLANT
COVER BACK TABLE 60X90IN (DRAPES) ×3 IMPLANT
COVER WAND RF STERILE (DRAPES) ×3 IMPLANT
DECANTER SPIKE VIAL GLASS SM (MISCELLANEOUS) ×3 IMPLANT
DERMABOND ADVANCED (GAUZE/BANDAGES/DRESSINGS) ×2
DERMABOND ADVANCED .7 DNX12 (GAUZE/BANDAGES/DRESSINGS) ×1 IMPLANT
DIFFUSER DRILL AIR PNEUMATIC (MISCELLANEOUS) ×3 IMPLANT
DRAPE C-ARM 42X72 X-RAY (DRAPES) ×3 IMPLANT
DRAPE C-ARMOR (DRAPES) ×3 IMPLANT
DRAPE LAPAROTOMY 100X72X124 (DRAPES) ×3 IMPLANT
DRAPE SURG 17X23 STRL (DRAPES) ×3 IMPLANT
DRSG OPSITE POSTOP 4X6 (GAUZE/BANDAGES/DRESSINGS) ×2 IMPLANT
DURAPREP 26ML APPLICATOR (WOUND CARE) ×3 IMPLANT
ELECT REM PT RETURN 9FT ADLT (ELECTROSURGICAL) ×3
ELECTRODE REM PT RTRN 9FT ADLT (ELECTROSURGICAL) ×1 IMPLANT
EVACUATOR 1/8 PVC DRAIN (DRAIN) IMPLANT
GAUZE 4X4 16PLY RFD (DISPOSABLE) IMPLANT
GAUZE SPONGE 4X4 12PLY STRL (GAUZE/BANDAGES/DRESSINGS) ×3 IMPLANT
GLOVE BIO SURGEON STRL SZ8 (GLOVE) ×6 IMPLANT
GLOVE BIOGEL PI IND STRL 8 (GLOVE) ×2 IMPLANT
GLOVE BIOGEL PI IND STRL 8.5 (GLOVE) ×2 IMPLANT
GLOVE BIOGEL PI INDICATOR 8 (GLOVE) ×4
GLOVE BIOGEL PI INDICATOR 8.5 (GLOVE) ×4
GLOVE ECLIPSE 8.0 STRL XLNG CF (GLOVE) ×6 IMPLANT
GLOVE EXAM NITRILE XL STR (GLOVE) IMPLANT
GOWN STRL REUS W/ TWL LRG LVL3 (GOWN DISPOSABLE) IMPLANT
GOWN STRL REUS W/ TWL XL LVL3 (GOWN DISPOSABLE) ×2 IMPLANT
GOWN STRL REUS W/TWL 2XL LVL3 (GOWN DISPOSABLE) ×6 IMPLANT
GOWN STRL REUS W/TWL LRG LVL3 (GOWN DISPOSABLE)
GOWN STRL REUS W/TWL XL LVL3 (GOWN DISPOSABLE) ×6
GRAFT BNE CANC CHIPS 1-8 20CC (Bone Implant) IMPLANT
HEMOSTAT POWDER KIT SURGIFOAM (HEMOSTASIS) ×3 IMPLANT
KIT BASIN OR (CUSTOM PROCEDURE TRAY) ×3 IMPLANT
KIT POSITION SURG JACKSON T1 (MISCELLANEOUS) ×3 IMPLANT
KIT TURNOVER KIT B (KITS) ×3 IMPLANT
MARKER SKIN DUAL TIP RULER LAB (MISCELLANEOUS) ×2 IMPLANT
MILL MEDIUM DISP (BLADE) IMPLANT
NDL HYPO 25X1 1.5 SAFETY (NEEDLE) ×1 IMPLANT
NDL SPNL 18GX3.5 QUINCKE PK (NEEDLE) IMPLANT
NEEDLE HYPO 22GX1.5 SAFETY (NEEDLE) ×2 IMPLANT
NEEDLE HYPO 25X1 1.5 SAFETY (NEEDLE) ×3 IMPLANT
NEEDLE SPNL 18GX3.5 QUINCKE PK (NEEDLE) IMPLANT
NS IRRIG 1000ML POUR BTL (IV SOLUTION) ×3 IMPLANT
OIL CARTRIDGE MAESTRO DRILL (MISCELLANEOUS) ×3
PACK LAMINECTOMY NEURO (CUSTOM PROCEDURE TRAY) ×3 IMPLANT
PAD ARMBOARD 7.5X6 YLW CONV (MISCELLANEOUS) ×9 IMPLANT
PATTIES SURGICAL .5 X.5 (GAUZE/BANDAGES/DRESSINGS) IMPLANT
PATTIES SURGICAL .5 X1 (DISPOSABLE) IMPLANT
PATTIES SURGICAL 1X1 (DISPOSABLE) IMPLANT
ROD RELINE LORDOTIC 5.5X45 (Rod) ×2 IMPLANT
ROD RELINE-O LORD 5.5X40 (Rod) ×2 IMPLANT
SCREW LOCK RELINE 5.5 TULIP (Screw) ×8 IMPLANT
SCREW RELINE-O POLY 7.5X45 (Screw) ×4 IMPLANT
SCREW RELINE-O POLY 7.5X50 (Screw) ×6 IMPLANT
SCREW RLINE PLY 2S 50X7.5XPA (Screw) IMPLANT
SPONGE LAP 4X18 RFD (DISPOSABLE) IMPLANT
SPONGE SURGIFOAM ABS GEL 100 (HEMOSTASIS) IMPLANT
STAPLER SKIN PROX WIDE 3.9 (STAPLE) IMPLANT
SUT VIC AB 1 CT1 18XBRD ANBCTR (SUTURE) ×2 IMPLANT
SUT VIC AB 1 CT1 8-18 (SUTURE) ×6
SUT VIC AB 2-0 CT1 18 (SUTURE) ×6 IMPLANT
SUT VIC AB 3-0 SH 8-18 (SUTURE) ×6 IMPLANT
SYR 5ML LL (SYRINGE) IMPLANT
SYRINGE 20CC LL (MISCELLANEOUS) ×2 IMPLANT
TOWEL GREEN STERILE (TOWEL DISPOSABLE) ×3 IMPLANT
TOWEL GREEN STERILE FF (TOWEL DISPOSABLE) ×3 IMPLANT
TRAY FOLEY MTR SLVR 16FR STAT (SET/KITS/TRAYS/PACK) ×3 IMPLANT
WATER STERILE IRR 1000ML POUR (IV SOLUTION) ×3 IMPLANT

## 2018-07-11 NOTE — Progress Notes (Signed)
Pt transferred to 4NP10 d/t unit closed on 3C. Report called to RN. Holli Humbles, RN

## 2018-07-11 NOTE — Interval H&P Note (Signed)
History and Physical Interval Note:  07/11/2018 7:35 AM  [name]  has presented today for surgery, with the diagnosis of Spondylolisthesis, Lumbar region.  The various methods of treatment have been discussed with the patient and family. After consideration of risks, benefits and other options for treatment, the patient has consented to  Procedure(s) with comments: Exploration/Extension of fusion with fusion at Lumbar 4-5 (N/A) - Exploration/Extension of fusion with fusion at Lumbar 4-5 as a surgical intervention.  The patient's history has been reviewed, patient examined, no change in status, stable for surgery.  I have reviewed the patient's chart and labs.  Questions were answered to the patient's satisfaction.     Peggyann Shoals

## 2018-07-11 NOTE — Plan of Care (Signed)
  Problem: Education: Goal: Ability to verbalize activity precautions or restrictions will improve Outcome: Progressing Goal: Knowledge of the prescribed therapeutic regimen will improve Outcome: Progressing Goal: Understanding of discharge needs will improve Outcome: Progressing   Problem: Activity: Goal: Ability to avoid complications of mobility impairment will improve Outcome: Progressing Goal: Ability to tolerate increased activity will improve Outcome: Progressing Goal: Will remain free from falls Outcome: Progressing   Problem: Bowel/Gastric: Goal: Gastrointestinal status for postoperative course will improve Outcome: Progressing   Problem: Clinical Measurements: Goal: Ability to maintain clinical measurements within normal limits will improve Outcome: Progressing Goal: Postoperative complications will be avoided or minimized Outcome: Progressing Goal: Diagnostic test results will improve Outcome: Progressing   Problem: Pain Management: Goal: Pain level will decrease Outcome: Progressing   Problem: Skin Integrity: Goal: Will show signs of wound healing Outcome: Progressing   Problem: Health Behavior/Discharge Planning: Goal: Identification of resources available to assist in meeting health care needs will improve Outcome: Progressing   Problem: Bladder/Genitourinary: Goal: Urinary functional status for postoperative course will improve Outcome: Progressing   Problem: Safety: Goal: Ability to remain free from injury will improve Outcome: Progressing   

## 2018-07-11 NOTE — Anesthesia Procedure Notes (Addendum)
Procedure Name: Intubation Date/Time: 07/11/2018 7:40 AM Performed by: Lance Coon, CRNA Pre-anesthesia Checklist: Patient identified, Emergency Drugs available, Suction available, Patient being monitored and Timeout performed Patient Re-evaluated:Patient Re-evaluated prior to induction Oxygen Delivery Method: Circle system utilized Preoxygenation: Pre-oxygenation with 100% oxygen Induction Type: IV induction and Rapid sequence Laryngoscope Size: Mac and 3 Grade View: Grade I Tube type: Oral Tube size: 7.0 mm Number of attempts: 1 Airway Equipment and Method: Stylet Placement Confirmation: ETT inserted through vocal cords under direct vision,  positive ETCO2 and breath sounds checked- equal and bilateral Secured at: 21 cm Tube secured with: Tape Dental Injury: Teeth and Oropharynx as per pre-operative assessment  Comments: Airway per [place]

## 2018-07-11 NOTE — Brief Op Note (Signed)
07/11/2018  11:12 AM  PATIENT:  [name]  63 y.o. female  PRE-OPERATIVE DIAGNOSIS:  Spondylolisthesis, Lumbar region, synovial cyst, stenosis, radiculopathy, lumbago  POST-OPERATIVE DIAGNOSIS: Spondylolisthesis, Lumbar region, synovial cyst, stenosis, radiculopathy, lumbago  PROCEDURE:  Procedure(s) with comments: Exploration/Extension of fusion with fusion at Lumbar 4-5 (N/A) - Exploration/Extension of fusion with fusion at Lumbar 4-5 with PLIF, pedicle screw fixation, posterolateral arthrodesis, resection of synovial cyst  SURGEON:  Surgeon(s) and Role:    Erline Levine, MD - Primary    Kristeen Miss, MD - Assisting  PHYSICIAN ASSISTANT:   ASSISTANTS: Poteat, RN   ANESTHESIA:   general  EBL:  500 mL   BLOOD ADMINISTERED:none  DRAINS: none   LOCAL MEDICATIONS USED:  MARCAINE    and LIDOCAINE   SPECIMEN:  No Specimen  DISPOSITION OF SPECIMEN:  N/A  COUNTS:  YES  TOURNIQUET:  * No tourniquets in log *  DICTATION: Patient is 63 year old woman with lumbar stenosis and previous fusion L L 5 S 1 level with spondylolisthesis at  L 45 and  history of severe back and bilateral leg pain.  It was elected to take her to surgery for exploration of previous fusion with decompression and fusion of L 45 level.   Procedure: Patient was placed in a prone position on the [place] table after smooth and uncomplicated induction of general endotracheal anesthesia. His low back was prepped and draped in usual sterile fashion with betadine scrub and DuraPrep. Area of incision was infiltrated with local lidocaine. Incision was made to the lumbodorsal fascia was incised and exposure was performed of the L 4 spinous processes laminae facet joint and transverse processes. Previous hardware was exposed.  This was covered with dense bone growth indicated solid arthrodesis. The bone screws were removed and appeared to be solidly in bone and there was dense bridging bone across the L 4 5 level.   Intraoperative x-ray was obtained which confirmed correct orientation with marker probes at L 4 through L 5 levles. A total laminectomy of  L 4  levels was performed with disarticulation of the facet joints and thorough decompression was performed of both  L 4 and L 5 nerve roots along with the common dural tube. Decompression was greater than would be typical for PLIF. A synovial cyst was carefully removed on the left.  A thorough discectomy was then performed on the left with preparation of the endplates for grafting a trial spacer was placed this level and a thorough discectomy was performed on the right as well at L 45.  After trial sizing and utilization of sequential shavers, interspaces were packed with autograft,  34mm  X 9 x 3mm x 12 degree lordotic PEEK cages with 10 cc of autograft in the intrerspace.   The posterolateral region was extensively decorticated and pedicle probes were placed at  L4 bilaterally. Intraoperative fluoroscopy confirmed correct orientationin the AP and lateral plane. 45 x 6.5 mm pedicle screws were placed at L 4 bilaterally and 50 x 7.5 mm screws placed at L 4bilaterally and 7.5 x 45 mm screws at L 5 bilaterally.  Final x-rays demonstrated well-positioned interbody grafts and pedicle screw fixation. A 40 mm lordotic rod was placed on the right and a 45 mm rod was placed on the left locked down in situ and the posterolateral region was packed with 20 cc bone graft on the right and 15 cc on the left. The wound was irrigated. Long-acting Marcaine was injected in the deep  musculature.  Fascia was closed with 1 Vicryl sutures skin edges were reapproximated 2 and 3-0 Vicryl sutures. The wound was dressed with Dermabond and  an occlusive dressing the patient was extubated in the operating room and taken to recovery in stable satisfactory condition she tolerated traction well counts were correct at the end of the case.   PLAN OF CARE: Admit to inpatient   PATIENT DISPOSITION:  PACU -  hemodynamically stable.   Delay start of Pharmacological VTE agent (>24hrs) due to surgical blood loss or risk of bleeding: yes

## 2018-07-11 NOTE — Op Note (Signed)
07/11/2018  11:12 AM  PATIENT:  [name]  63 y.o. female  PRE-OPERATIVE DIAGNOSIS:  Spondylolisthesis, Lumbar region, synovial cyst, stenosis, radiculopathy, lumbago  POST-OPERATIVE DIAGNOSIS: Spondylolisthesis, Lumbar region, synovial cyst, stenosis, radiculopathy, lumbago  PROCEDURE:  Procedure(s) with comments: Exploration/Extension of fusion with fusion at Lumbar 4-5 (N/A) - Exploration/Extension of fusion with fusion at Lumbar 4-5 with PLIF, pedicle screw fixation, posterolateral arthrodesis, resection of synovial cyst  SURGEON:  Surgeon(s) and Role:    Erline Levine, MD - Primary    Kristeen Miss, MD - Assisting  PHYSICIAN ASSISTANT:   ASSISTANTS: Poteat, RN   ANESTHESIA:   general  EBL:  500 mL   BLOOD ADMINISTERED:none  DRAINS: none   LOCAL MEDICATIONS USED:  MARCAINE    and LIDOCAINE   SPECIMEN:  No Specimen  DISPOSITION OF SPECIMEN:  N/A  COUNTS:  YES  TOURNIQUET:  * No tourniquets in log *  DICTATION: Patient is 63 year old woman with lumbar stenosis and previous fusion L L 5 S 1 level with spondylolisthesis at  L 45 and  history of severe back and bilateral leg pain.  It was elected to take her to surgery for exploration of previous fusion with decompression and fusion of L 45 level.   Procedure: Patient was placed in a prone position on the [place] table after smooth and uncomplicated induction of general endotracheal anesthesia. His low back was prepped and draped in usual sterile fashion with betadine scrub and DuraPrep. Area of incision was infiltrated with local lidocaine. Incision was made to the lumbodorsal fascia was incised and exposure was performed of the L 4 spinous processes laminae facet joint and transverse processes. Previous hardware was exposed.  This was covered with dense bone growth indicated solid arthrodesis. The bone screws were removed and appeared to be solidly in bone and there was dense bridging bone across the L 4 5 level.   Intraoperative x-ray was obtained which confirmed correct orientation with marker probes at L 4 through L 5 levles. A total laminectomy of  L 4  levels was performed with disarticulation of the facet joints and thorough decompression was performed of both  L 4 and L 5 nerve roots along with the common dural tube. Decompression was greater than would be typical for PLIF. A synovial cyst was carefully removed on the left.  A thorough discectomy was then performed on the left with preparation of the endplates for grafting a trial spacer was placed this level and a thorough discectomy was performed on the right as well at L 45.  After trial sizing and utilization of sequential shavers, interspaces were packed with autograft,  47mm  X 9 x 29mm x 12 degree lordotic PEEK cages with 10 cc of autograft in the intrerspace.   The posterolateral region was extensively decorticated and pedicle probes were placed at  L4 bilaterally. Intraoperative fluoroscopy confirmed correct orientationin the AP and lateral plane. 45 x 6.5 mm pedicle screws were placed at L 4 bilaterally and 50 x 7.5 mm screws placed at L 4bilaterally and 7.5 x 45 mm screws at L 5 bilaterally.  Final x-rays demonstrated well-positioned interbody grafts and pedicle screw fixation. A 40 mm lordotic rod was placed on the right and a 45 mm rod was placed on the left locked down in situ and the posterolateral region was packed with 20 cc bone graft on the right and 15 cc on the left. The wound was irrigated. Long-acting Marcaine was injected in the deep  musculature.  Fascia was closed with 1 Vicryl sutures skin edges were reapproximated 2 and 3-0 Vicryl sutures. The wound was dressed with Dermabond and  an occlusive dressing the patient was extubated in the operating room and taken to recovery in stable satisfactory condition she tolerated traction well counts were correct at the end of the case.   PLAN OF CARE: Admit to inpatient   PATIENT DISPOSITION:  PACU -  hemodynamically stable.   Delay start of Pharmacological VTE agent (>24hrs) due to surgical blood loss or risk of bleeding: yes

## 2018-07-11 NOTE — Progress Notes (Signed)
Awake, alert, conversant.  MAEW with good strength.  Improved pain and numbness.  Doing well.

## 2018-07-11 NOTE — Transfer of Care (Signed)
Immediate Anesthesia Transfer of Care Note  Patient: [name]  Procedure(s) Performed: Exploration/Extension of fusion with fusion at Lumbar 4-5 (N/A Back)  Patient Location: PACU  Anesthesia Type:General  Level of Consciousness: awake and patient cooperative  Airway & Oxygen Therapy: Patient Spontanous Breathing  Post-op Assessment: Report given to RN and Post -op Vital signs reviewed and stable  Post vital signs: Reviewed and stable  Last Vitals:  Vitals Value Taken Time  BP 112/64 07/11/2018 10:57 AM  Temp    Pulse 114 07/11/2018 10:58 AM  Resp 44 07/11/2018 10:56 AM  SpO2 96 % 07/11/2018 10:58 AM  Vitals shown include unvalidated device data.  Last Pain:  Vitals:   07/11/18 0547  TempSrc: Oral         Complications: No apparent anesthesia complications

## 2018-07-11 NOTE — Anesthesia Postprocedure Evaluation (Signed)
Anesthesia Post Note  Patient: MELONIE GERMANI  Procedure(s) Performed: Exploration/Extension of fusion with fusion at Lumbar 4-5 (N/A Back)     Patient location during evaluation: PACU Anesthesia Type: General Level of consciousness: awake and alert Pain management: pain level controlled Vital Signs Assessment: post-procedure vital signs reviewed and stable Respiratory status: spontaneous breathing, nonlabored ventilation, respiratory function stable and patient connected to nasal cannula oxygen Cardiovascular status: blood pressure returned to baseline and stable Postop Assessment: no apparent nausea or vomiting Anesthetic complications: no    Last Vitals:  Vitals:   07/11/18 1211 07/11/18 1226  BP: 106/77 102/67  Pulse: 91 75  Resp: 18 18  Temp: 36.4 C   SpO2: 97% 98%               Effie Berkshire

## 2018-07-11 NOTE — Anesthesia Preprocedure Evaluation (Addendum)
Anesthesia Evaluation  Patient identified by MRN, date of birth, ID band Patient awake    Reviewed: Allergy & Precautions, NPO status , Patient's Chart, lab work & pertinent test results  Airway Mallampati: I  TM Distance: >3 FB Neck ROM: Full    Dental  (+) Teeth Intact, Dental Advisory Given   Pulmonary former smoker,    breath sounds clear to auscultation       Cardiovascular negative cardio ROS   Rhythm:Regular Rate:Normal     Neuro/Psych negative neurological ROS  negative psych ROS   GI/Hepatic negative GI ROS, Neg liver ROS,   Endo/Other  negative endocrine ROS  Renal/GU negative Renal ROS     Musculoskeletal negative musculoskeletal ROS (+)   Abdominal Normal abdominal exam  (+)   Peds  Hematology negative hematology ROS (+)   Anesthesia Other Findings   Reproductive/Obstetrics                            Anesthesia Physical Anesthesia Plan  ASA: II  Anesthesia Plan: General   Post-op Pain Management:    Induction: Intravenous  PONV Risk Score and Plan: 4 or greater and Ondansetron, Dexamethasone, Midazolam and Scopolamine patch - Pre-op  Airway Management Planned: Oral ETT  Additional Equipment: None  Intra-op Plan:   Post-operative Plan: Extubation in OR  Informed Consent: I have reviewed the patients History and Physical, chart, labs and discussed the procedure including the risks, benefits and alternatives for the proposed anesthesia with the patient or authorized representative who has indicated his/her understanding and acceptance.     Dental advisory given  Plan Discussed with: CRNA  Anesthesia Plan Comments:        Anesthesia Quick Evaluation

## 2018-07-12 MED ORDER — KETOROLAC TROMETHAMINE 15 MG/ML IJ SOLN
15.0000 mg | Freq: Four times a day (QID) | INTRAMUSCULAR | Status: DC | PRN
  Administered 2018-07-12 – 2018-07-13 (×4): 15 mg via INTRAVENOUS
  Filled 2018-07-12 (×4): qty 1

## 2018-07-12 NOTE — Progress Notes (Signed)
Patient ID: [name], female   DOB: May 01, 1955, 63 y.o.   MRN: [id number] Vital signs are stable Patient reports moderate pain in the lumbar spine Dressings are clean and dry Motor function appears intact We will add Toradol to pain regimen Continue to observe Stable postop day 1

## 2018-07-12 NOTE — Evaluation (Addendum)
Physical Therapy Evaluation Patient Details Name: [name] MRN: [id number] DOB: [birth year] Today's Date: 07/12/2018   History of Present Illness  Pt is a 63 yr old female who had exploration/Extension of fusion with fusion at Lumbar 4-5, exploration/Extension of fusion with fusion at Lumbar 4-5 with PLIF, pedicle screw fixation, posterolateral arthrodesis, resection of synovial cyst.   Clinical Impression  Patient seen for mobility assessment s/p spinal surgery. Mobilizing well. Educated patient on precautions, mobility expectations, safety and car transfers. No further acute PT needs. Will sign off.    Follow Up Recommendations No PT follow up    Equipment Recommendations  3in1 (PT)    Recommendations for Other Services       Precautions / Restrictions Precautions Precautions: Back Precaution Booklet Issued: Yes (comment) Precaution Comments: Pt able to voice 3/3 precautions Required Braces or Orthoses: Spinal Brace Spinal Brace: Applied in sitting position Restrictions Weight Bearing Restrictions: No      Mobility  Bed Mobility Overal bed mobility: Modified Independent Bed Mobility: Rolling;Supine to Sit;Sit to Supine              Transfers Overall transfer level: Modified independent Equipment used: None                Ambulation/Gait Ambulation/Gait assistance: Modified independent (Device/Increase time) Gait Distance (Feet): 240 Feet Assistive device: None Gait Pattern/deviations: WFL(Within Functional Limits) Gait velocity: decreased Gait velocity interpretation: <1.8 ft/sec, indicate of risk for recurrent falls General Gait Details: modestly decreased cadence, no physical assist required.  Stairs Stairs: Yes Stairs assistance: Supervision Stair Management: One rail Right Number of Stairs: 5 General stair comments: Supervision for safety  Wheelchair Mobility    Modified Rankin (Stroke Patients Only)       Balance Overall balance  assessment: Mild deficits observed, not formally tested                                           Pertinent Vitals/Pain Pain Assessment: 0-10 Pain Score: 8  Pain Location: back Pain Descriptors / Indicators: Aching Pain Intervention(s): Monitored during session    Home Living Family/patient expects to be discharged to:: Private residence Living Arrangements: Spouse/significant other Available Help at Discharge: Family Type of Home: House Home Access: Stairs to enter Entrance Stairs-Rails: Psychiatric nurse of Steps: 4-5 Home Layout: Multi-level Home Equipment: Grab bars - tub/shower;Adaptive equipment      Prior Function Level of Independence: Independent               Hand Dominance   Dominant Hand: Right    Extremity/Trunk Assessment   Upper Extremity Assessment Upper Extremity Assessment: Overall WFL for tasks assessed    Lower Extremity Assessment Lower Extremity Assessment: Overall WFL for tasks assessed    Cervical / Trunk Assessment Cervical / Trunk Assessment: Other exceptions Cervical / Trunk Exceptions: s/p sx  Communication   Communication: No difficulties  Cognition Arousal/Alertness: Awake/alert Behavior During Therapy: WFL for tasks assessed/performed Overall Cognitive Status: Within Functional Limits for tasks assessed                                        General Comments      Exercises     Assessment/Plan    PT Assessment Patent does not need any further PT services  PT Problem List         PT Treatment Interventions      PT Goals (Current goals can be found in the Care Plan section)  Acute Rehab PT Goals Patient Stated Goal: to decrease pain PT Goal Formulation: All assessment and education complete, DC therapy    Frequency     Barriers to discharge        Co-evaluation               AM-PAC PT "6 Clicks" Mobility  Outcome Measure Help needed turning from  your back to your side while in a flat bed without using bedrails?: None Help needed moving from lying on your back to sitting on the side of a flat bed without using bedrails?: None Help needed moving to and from a bed to a chair (including a wheelchair)?: None Help needed standing up from a chair using your arms (e.g., wheelchair or bedside chair)?: None Help needed to walk in hospital room?: A Little Help needed climbing 3-5 steps with a railing? : A Little 6 Click Score: 22    End of Session Equipment Utilized During Treatment: Gait belt;Back brace Activity Tolerance: Patient tolerated treatment well Patient left: in bed;with call bell/phone within reach;with family/visitor present Nurse Communication: Mobility status PT Visit Diagnosis: Pain;Difficulty in walking, not elsewhere classified (R26.2) Pain - Right/Left: (back)    Time: 7185-5015 PT Time Calculation (min) (ACUTE ONLY): 18 min   Charges:   PT Evaluation $PT Eval Low Complexity: [place], PT DPT  Board Certified Neurologic Specialist Acute Rehabilitation Services Pager [phone] Office [phone]   Duncan Dull 07/12/2018, 2:25 PM

## 2018-07-12 NOTE — Evaluation (Signed)
Occupational Therapy Evaluation Patient Details Name: [name] MRN: [id number] DOB: [birth year] Today's Date: 07/12/2018    History of Present Illness Pt is a 63 yr old female who had exploration/Extension of fusion with fusion at Lumbar 4-5, exploration/Extension of fusion with fusion at Lumbar 4-5 with PLIF, pedicle screw fixation, posterolateral arthrodesis, resection of synovial cyst.    Clinical Impression   Patient is s/p exploration/Extension of fusion with fusion at Lumbar 4-5, exploration/Extension of fusion with fusion at Lumbar 4-5 with PLIF, pedicle screw fixation, posterolateral arthrodesis, resection of synovial cyst. The patient was independent in ADLs prior to sx. The patient lives in a 3 story home but can live on the main level but requires to go up 4-5 steps to enter. Patient will have husband when returning home 24/7. Patient was able to voice 3/3 precautions and don/doff brace. Patient was a tub shower unit on the first floor with a grab bar and low toilet. It was recommended to have 3 in 1 commode and shower seat in shower. The patient required occasional cues for twisting in session. The patient no longer needs acute Occupational Therapy services.     Follow Up Recommendations  No OT follow up;Supervision/Assistance - 24 hour    Equipment Recommendations  3 in 1 bedside commode    Recommendations for Other Services       Precautions / Restrictions Precautions Precautions: Back Precaution Booklet Issued: Yes (comment) Precaution Comments: Pt able to voice 3/3 precautions Required Braces or Orthoses: Spinal Brace Spinal Brace: Applied in sitting position Restrictions Weight Bearing Restrictions: No      Mobility Bed Mobility Overal bed mobility: Needs Assistance Bed Mobility: Rolling;Supine to Sit;Sit to Supine Rolling: Supervision   Supine to sit: Supervision Sit to supine: Supervision   General bed mobility comments: cue for log rolling and use of  grab bar  Transfers Overall transfer level: Modified independent Equipment used: None                  Balance Overall balance assessment: Mild deficits observed, not formally tested                                         ADL either performed or assessed with clinical judgement   ADL Overall ADL's : Needs assistance/impaired Eating/Feeding: Independent;Sitting   Grooming: Wash/dry hands;Independent;Standing   Upper Body Bathing: Modified independent;Sitting;Standing   Lower Body Bathing: Modified independent;Sitting/lateral leans   Upper Body Dressing : Modified independent;Sitting;Standing   Lower Body Dressing: Modified independent;Sit to/from stand   Toilet Transfer: Supervision/safety;Comfort height toilet;Grab bars   Toileting- Water quality scientist and Hygiene: Independent   Tub/ Banker: Tub transfer;Supervision/safety;Grab bars   Functional mobility during ADLs: Supervision/safety;Cueing for safety       Vision Baseline Vision/History: Wears glasses Wears Glasses: Reading only Patient Visual Report: No change from baseline Vision Assessment?: No apparent visual deficits     Perception Perception Perception Tested?: No   Praxis Praxis Praxis tested?: Not tested    Pertinent Vitals/Pain Pain Assessment: 0-10 Pain Score: 8  Pain Location: back Pain Descriptors / Indicators: Aching Pain Intervention(s): Limited activity within patient's tolerance;Monitored during session;Repositioned     Hand Dominance Right   Extremity/Trunk Assessment Upper Extremity Assessment Upper Extremity Assessment: Overall WFL for tasks assessed   Lower Extremity Assessment Lower Extremity Assessment: Defer to PT evaluation   Cervical / Trunk Assessment Cervical /  Trunk Assessment: Other exceptions Cervical / Trunk Exceptions: s/p sx   Communication Communication Communication: No difficulties   Cognition Arousal/Alertness:  Awake/alert Behavior During Therapy: WFL for tasks assessed/performed Overall Cognitive Status: Within Functional Limits for tasks assessed                                     General Comments       Exercises     Shoulder Instructions      Home Living Family/patient expects to be discharged to:: Private residence Living Arrangements: Spouse/significant other Available Help at Discharge: Family Type of Home: House Home Access: Stairs to enter Technical brewer of Steps: 4-5 Entrance Stairs-Rails: Right;Left Home Layout: Multi-level Alternate Level Stairs-Number of Steps: 3 story home but can live on first floor   Bathroom Shower/Tub: Teacher, early years/pre: Standard Bathroom Accessibility: Yes How Accessible: Accessible via [place];Adaptive equipment Adaptive Equipment: Reacher        Prior Functioning/Environment Level of Independence: Independent                 OT Problem List: Decreased strength;Decreased activity tolerance;Pain;Decreased safety awareness      OT Treatment/Interventions:      OT Goals(Current goals can be found in the care plan section) Acute Rehab OT Goals Patient Stated Goal: to decrease pain OT Goal Formulation: With patient Time For Goal Achievement: 07/19/18 Potential to Achieve Goals: Good  OT Frequency:     Barriers to D/C:            Co-evaluation              AM-PAC OT "6 Clicks" Daily Activity     Outcome Measure Help from another person eating meals?: None Help from another person taking care of personal grooming?: None Help from another person toileting, which includes using toliet, bedpan, or urinal?: None Help from another person bathing (including washing, rinsing, drying)?: A Little Help from another person to put on and taking off regular upper body clothing?: None Help from another person to put on and taking off regular lower body  clothing?: None 6 Click Score: 23   End of Session Equipment Utilized During Treatment: Gait belt;Back brace  Activity Tolerance: Patient tolerated treatment well Patient left: in bed;with call bell/phone within reach;with family/visitor present  OT Visit Diagnosis: Unsteadiness on feet (R26.81);Pain                Time: 0737-0800 OT Time Calculation (min): 23 min Charges:  OT General Charges $OT Visit: 1 Visit OT Treatments $Self Care/Home Management : 8-22 mins  Joeseph Amor OTR/L  Acute Rehab Services  [phone] office number [phone] pager number   Joeseph Amor 07/12/2018, 8:14 AM

## 2018-07-13 MED ORDER — METHOCARBAMOL 500 MG PO TABS: 500.0000 mg | ORAL_TABLET | Freq: Four times a day (QID) | ORAL | 3 refills | Status: DC | PRN

## 2018-07-13 MED ORDER — PANTOPRAZOLE SODIUM 40 MG PO TBEC: 40.0000 mg | DELAYED_RELEASE_TABLET | Freq: Every day | ORAL | Status: DC

## 2018-07-13 MED ORDER — HYDROCODONE-ACETAMINOPHEN 5-325 MG PO TABS: 2.0000 | ORAL_TABLET | ORAL | 0 refills | Status: DC | PRN

## 2018-07-13 NOTE — Discharge Summary (Signed)
Physician Discharge Summary  Patient ID: [name] MRN: [id number] DOB/AGE: Oct 26, 1955 63 y.o.  Admit date: 07/11/2018 Discharge date: 07/13/2018  Admission Diagnoses: Spondylolisthesis L4-L5 with lumbar radiculopathy  Discharge Diagnoses: Spondylolisthesis L4-L5 with lumbar radiculopathy Active Problems:   Spondylolisthesis of lumbar region   Discharged Condition: good  Hospital Course: Patient was admitted to undergo surgical decompression and arthrodesis at L4-L5 she tolerated surgery well.  Consults: None  Significant Diagnostic Studies: None  Treatments: surgery: Decompression of L4-L5 with extension of fusion from L5-S1 to include L4-L5.  Discharge Exam: Blood pressure 94/60, pulse 73, temperature 98.2 F (36.8 C), temperature source Oral, resp. rate 16, SpO2 96 %. Incision is clean and dry Station and gait are intact  Disposition: Discharge disposition: 01-Home or Self Care       Discharge Instructions    Call MD for:  redness, tenderness, or signs of infection (pain, swelling, redness, odor or green/yellow discharge around incision site)   Complete by:  As directed    Call MD for:  severe uncontrolled pain   Complete by:  As directed    Call MD for:  temperature >100.4   Complete by:  As directed    Diet - low sodium heart healthy   Complete by:  As directed    Increase activity slowly   Complete by:  As directed      Allergies as of 07/13/2018   No Known Allergies     Medication List    TAKE these medications   acetaminophen 325 MG tablet Commonly known as:  TYLENOL Take 650 mg by mouth 2 (two) times daily as needed (pain).   gabapentin 300 MG capsule Commonly known as:  NEURONTIN Take 300 mg by mouth 3 (three) times daily.   HYDROcodone-acetaminophen 5-325 MG tablet Commonly known as:  NORCO/VICODIN Take 2 tablets by mouth every 4 (four) hours as needed for severe pain ((score 7 to 10)).   ibuprofen 200 MG tablet Commonly known as:   ADVIL,MOTRIN Take 400 mg by mouth 2 (two) times daily as needed (pain).   methocarbamol 500 MG tablet Commonly known as:  ROBAXIN Take 1 tablet (500 mg total) by mouth every 6 (six) hours as needed for muscle spasms.        Signed: Blanchie Dessert Antwanette Wesche 07/13/2018, 1:42 PM

## 2018-07-13 NOTE — Discharge Instructions (Signed)
See provider instructions, patient verbalized understanding

## 2018-07-13 NOTE — Progress Notes (Signed)
CSW received consult for SNF placement. CSW noted PT did not reccommended any PT follow-up or SNF. CSW signing off at this time.  Lamonte Richer, LCSW, [place] [facility] [facility] [phone]

## 2018-07-13 NOTE — Progress Notes (Addendum)
RN Case Manager Caryl Pina ordered raised toilet seat or DME 1 in 3 and is presently reviewing chart. Information relayed to patient. All belongings returned in patient possession. IV removed. Patient condition unchanged from earlier assessment. Waiting on DME delivery.  Simmie Davies RN

## 2018-07-13 NOTE — TOC Transition Note (Signed)
Transition of Care [facility]) - CM/SW Discharge Note   Patient Details  Name: [name] MRN: [id number] Date of Birth: May 24, 1955  [facility] CM/SW Contact:  Claudie Leach, RN Phone Number: 07/13/2018, 3:03 PM    Final next level of care: Home/Self Care Barriers to Discharge: No Barriers Identified               Discharge Plan and Services     Post Acute Care Choice: Durable Medical Equipment          DME Arranged: 3-N-1 DME Agency: [employer]       Readmission Risk Interventions No flowsheet data found.

## 2018-07-13 NOTE — Progress Notes (Signed)
DME brought and patient taken downstairs to meet husband with care by [facility]. All belongings with patient. Husband took DME. Simmie Davies RN

## 2020-08-02 IMAGING — MR MR LUMBAR SPINE WO/W CM
4 of 7 series · 23 of 48 positions shown · IV contrast (14 ml multihance)
Comparison: MRI of the lumbar spine [DATE]

CLINICAL DATA: Worsening low back pain for 4 months, RIGHT-sided
weakness. Prior lumbar spine surgeries 3080 and 0770.

EXAM:
MRI LUMBAR SPINE WITHOUT AND WITH CONTRAST
TECHNIQUE: Multiplanar and multiecho pulse sequences of the lumbar spine were
obtained without and with intravenous contrast.
CONTRAST:  14mL MULTIHANCE GADOBENATE DIMEGLUMINE 529 MG/ML IV SOLN

[Series 3: T1 · sagittal · 4.0mm · 0.55mm/px · 5 of 15 slices shown (1 of 2)]
[im 1/15]
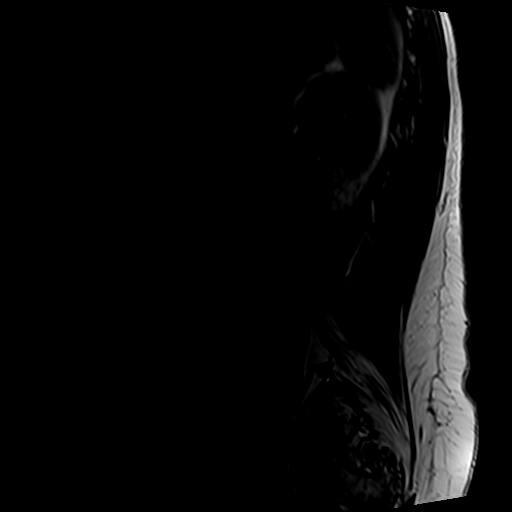
[im 4/15]
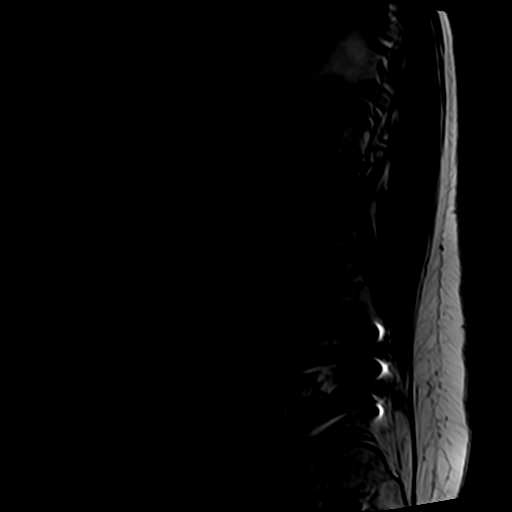
[im 8/15]
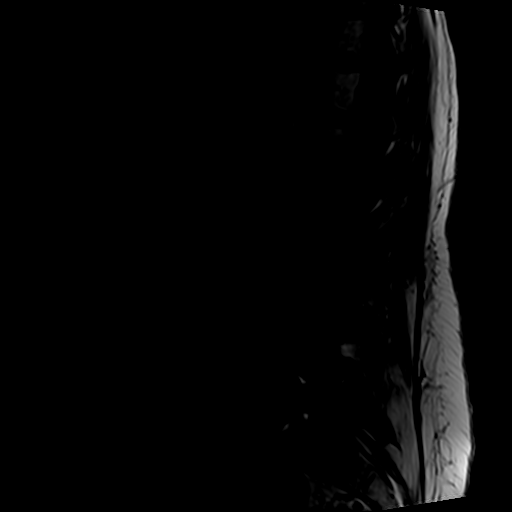
[im 11/15]
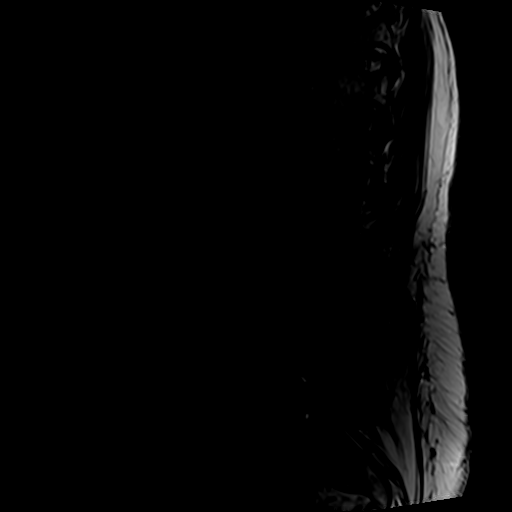
[im 15/15]
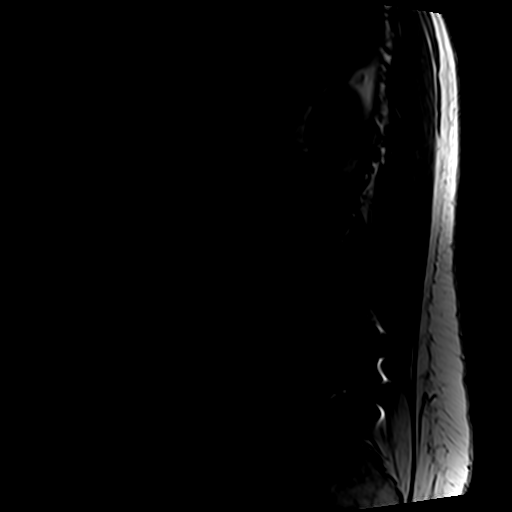

[Series 5: T2 · axial · 4.0mm · 0.70mm/px · z∈[-37,+161]mm · 8 of 35 slices shown (1 of 2)]
[im 1/35]
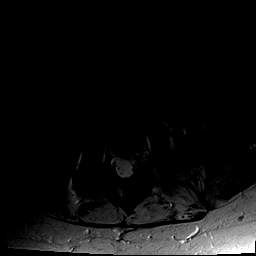
[im 4/35]
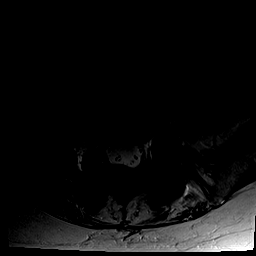
[im 12/35]
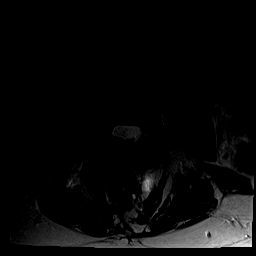
[im 16/35]
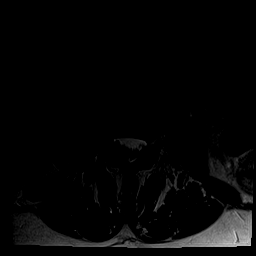
[im 19/35]
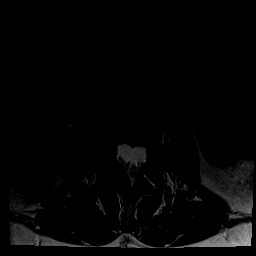
[im 23/35]
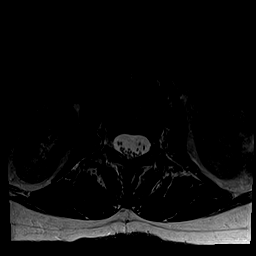
[im 31/35]
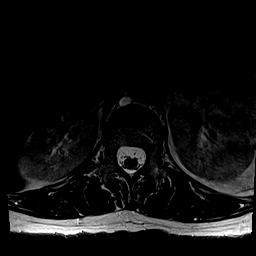
[im 35/35]
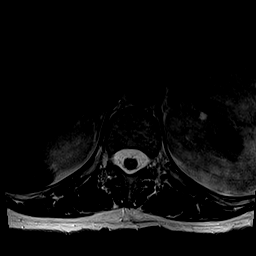

[Series 6: T1 · axial · 4.0mm · 0.35mm/px · z∈[-37,+141]mm · 6 of 35 slices shown (2 of 2)]
[im 1/35]
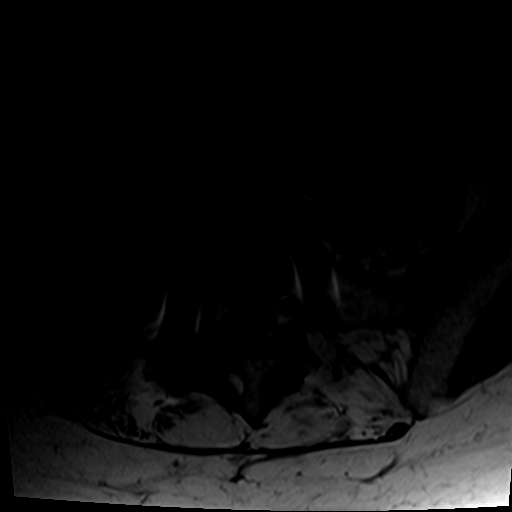
[im 4/35]
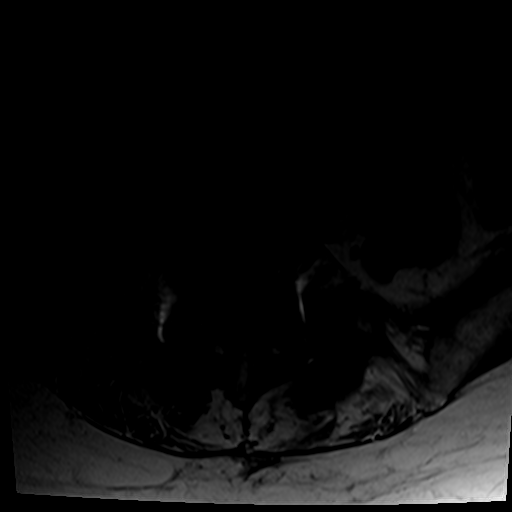
[im 12/35]
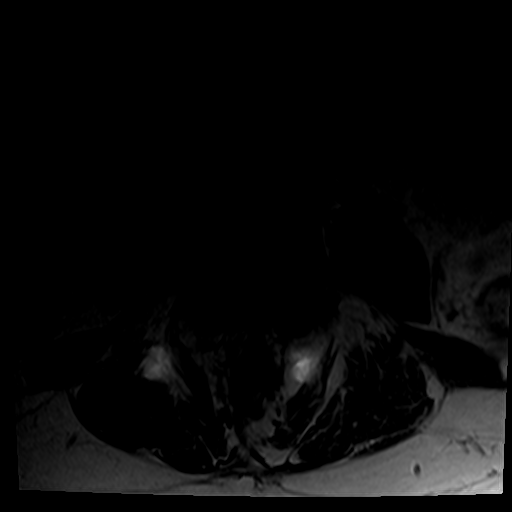
[im 16/35]
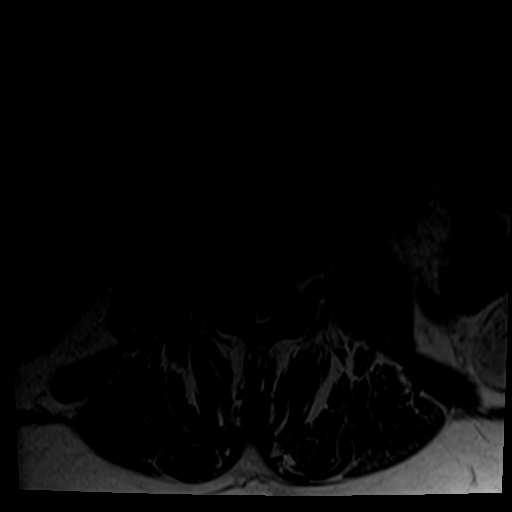
[im 19/35]
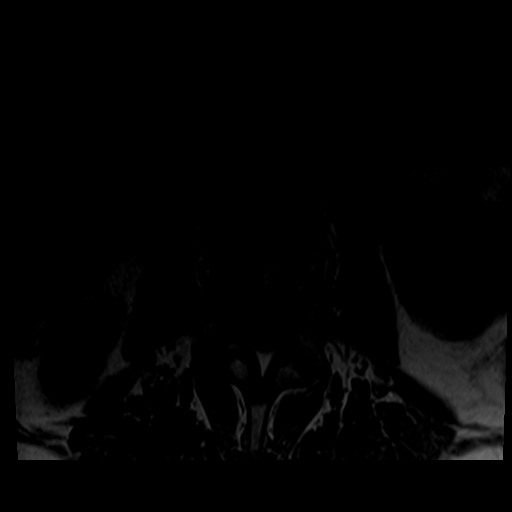
[im 31/35]
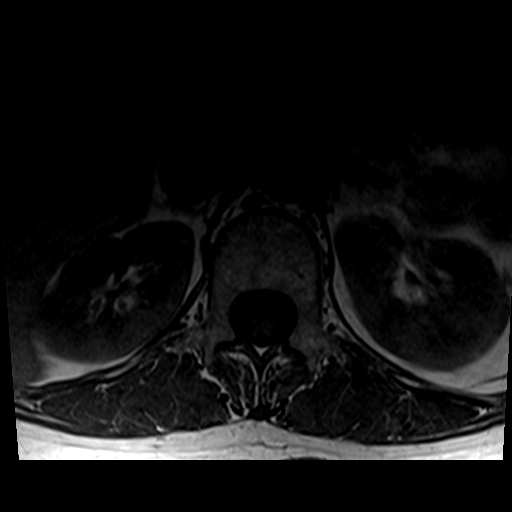

[Series 7: T2 · sagittal · 4.0mm · 0.55mm/px · 4 of 15 slices shown (2 of 2)]
[im 1/15]
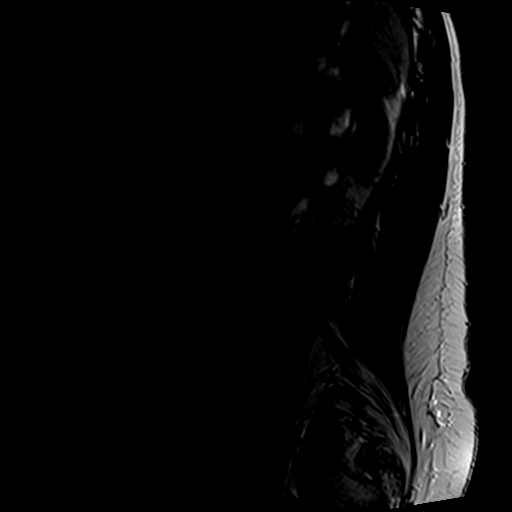
[im 5/15]
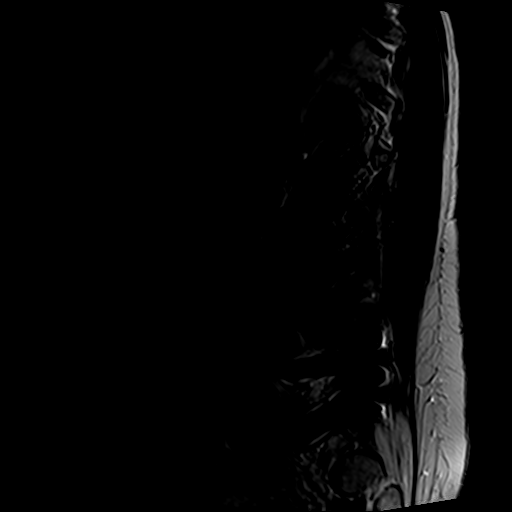
[im 10/15]
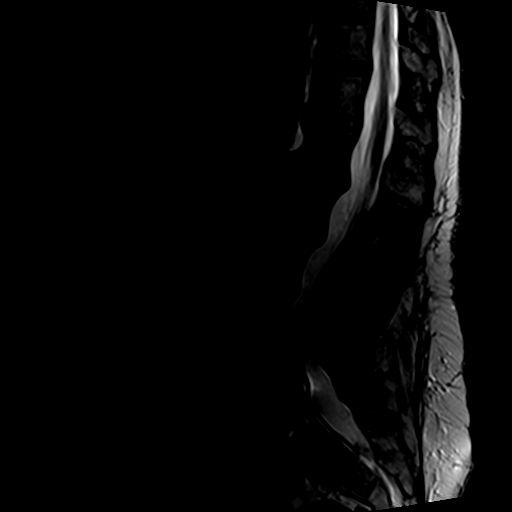
[im 15/15]
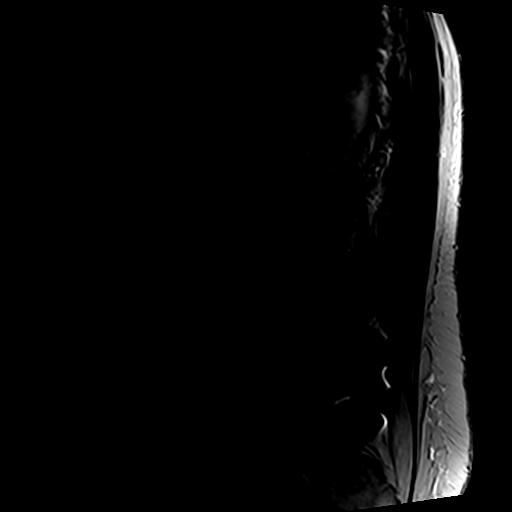

[23 of 48 positions shown; findings below may reference images not displayed]

FINDINGS: SEGMENTATION: For the purposes of this report, the last well-formed
intervertebral disc is reported as L5-S1.

ALIGNMENT: Maintained lumbar lordosis. New grade 2 (7 mm) L4-5
anterolisthesis. No spondylolysis.

VERTEBRAE: Vertebral bodies are intact. Status post L5-S1 PLIF,
susceptibility artifact from pedicle screws. Moderate L4-5 disc
height loss progressed from prior examination, mild L1-2 disc height
loss with moderate L1-2 disc chronic discogenic endplate changes.
Mild disc desiccation. No acute or abnormal bone marrow signal. No
abnormal osseous or disc enhancement.

CONUS MEDULLARIS AND CAUDA EQUINA: Conus medullaris terminates at
L1-2 and demonstrates normal morphology and signal characteristics.
Central displacement of the cauda equina at L4-5 due to canal
stenosis. No nerve root clumping. No abnormal cord, leptomeningeal
or epidural enhancement.

PARASPINAL AND OTHER SOFT TISSUES: Nonacute. Symmetric paraspinal
denervation at and below the level surgical intervention.

DISC LEVELS:

T12-L1: No disc bulge, canal stenosis nor neural foraminal
narrowing.

L1-2: Small broad-based disc bulge, minimal facet arthropathy and
ligamentum flavum redundancy without canal stenosis or neural
foraminal narrowing.

L2-3: Annular bulging without canal stenosis or neural foraminal
narrowing. Minimal facet arthropathy.

L3-4: Small broad-based disc bulge. Moderate facet arthropathy and
ligamentum flavum redundancy with trace reactive facet effusions. No
canal stenosis or neural foraminal narrowing.

L4-5: Anterolisthesis. Unroofing of small broad-based disc bulge. At
least moderate facet arthropathy, limited by hardware artifact.
Moderate to severe canal stenosis with narrowed lateral recesses
which may affect the traversing L5 nerves. Mild RIGHT and moderate
to severe LEFT neural foraminal narrowing.

L5-S1: PLIF and posterior decompression without canal stenosis or
neural foraminal narrowing.
IMPRESSION: 1. Status post L5-S1 PLIF, worsening L4-5 adjacent segment disease
including grade 2 L4-5 anterolisthesis.
2. Moderate to severe canal stenosis L4-5. Moderate to severe LEFT
L4-5 neural foraminal narrowing.
3. No acute osseous process.

## 2020-12-23 ENCOUNTER — Ambulatory Visit: Payer: Self-pay | Admitting: Nurse Practitioner

## 2020-12-30 ENCOUNTER — Encounter: Payer: Self-pay | Admitting: Nurse Practitioner

## 2020-12-30 ENCOUNTER — Ambulatory Visit (INDEPENDENT_AMBULATORY_CARE_PROVIDER_SITE_OTHER): Payer: 59 | Admitting: Nurse Practitioner

## 2020-12-30 ENCOUNTER — Other Ambulatory Visit: Payer: Self-pay

## 2020-12-30 VITALS — BP 105/72 | HR 90 | Temp 98.2°F | Ht 62.0 in | Wt 139.6 lb

## 2020-12-30 DIAGNOSIS — Z1211 Encounter for screening for malignant neoplasm of colon: Secondary | ICD-10-CM

## 2020-12-30 DIAGNOSIS — Z7689 Persons encountering health services in other specified circumstances: Secondary | ICD-10-CM | POA: Diagnosis not present

## 2020-12-30 NOTE — Progress Notes (Signed)
New Patient Office Visit  Subjective:  Patient ID: [name], female    DOB: 01/14/1956  Age: 65 y.o. MRN: [id number]  CC:  Chief Complaint  Patient presents with   New Patient (Initial Visit)    HPI [name] presents to establish new primary care provider.  She is coming from [employer].  She has no physical concerns or complaints.  States that she does have some worry and anxiety in the evening.  Able to handle them with coping mechanisms.  She is able to sleep okay.  She has history of spinal fusion of L5/S1.  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  She does take gabapentin 300 mg 3 times daily to help with this.  She is followed per neurosurgery for this. She is due to have a routine physical.  She should have routine, fasting labs before.  She is also due for colon cancer screening.  History reviewed. No pertinent past medical history.  Past Surgical History:  Procedure Laterality Date   APPENDECTOMY     LUMBAR FUSION  1999   L5-S1   OVARIAN CYST REMOVAL     TUBAL LIGATION      History reviewed. No pertinent family history.  Social History   Socioeconomic History   Marital status: Married    Spouse name: Not on file   Number of children: Not on file   Years of education: Not on file   Highest education level: Not on file  Occupational History   Not on file  Tobacco Use   Smoking status: Never   Smokeless tobacco: Never  Vaping Use   Vaping Use: Never used  Substance and Sexual Activity   Alcohol use: Yes    Comment: occasionally   Drug use: Never   Sexual activity: Yes  Other Topics Concern   Not on file  Social History Narrative   ** Merged History Encounter **       Social Determinants of Health   Financial Resource Strain: Not on file  Food Insecurity: Not on file  Transportation Needs: Not on file  Physical Activity: Not on file  Stress: Not on file  Social Connections: Not on file  Intimate Partner Violence: Not on file     ROS Review of Systems  Constitutional:  Negative for activity change, appetite change, chills, fatigue and fever.  HENT:  Negative for congestion, postnasal drip, rhinorrhea, sinus pressure, sinus pain, sneezing and sore throat.   Eyes: Negative.   Respiratory:  Negative for cough, chest tightness, shortness of breath and wheezing.   Cardiovascular:  Negative for chest pain and palpitations.  Gastrointestinal:  Negative for abdominal pain, constipation, diarrhea, nausea and vomiting.  Endocrine: Negative for cold intolerance, heat intolerance, polydipsia and polyuria.  Genitourinary:  Negative for dyspareunia, dysuria, flank pain, frequency and urgency.  Musculoskeletal:  Negative for arthralgias, back pain and myalgias.  Skin:  Negative for rash.  Allergic/Immunologic: Negative for environmental allergies.  Neurological:  Positive for numbness. Negative for dizziness, weakness and headaches.  Hematological:  Negative for adenopathy.  Psychiatric/Behavioral:  The patient is nervous/anxious.    Objective:   Today's Vitals   12/30/20 1149  BP: 105/72  Pulse: 90  Temp: 98.2 F (36.8 C)  SpO2: 98%  Weight: 139 lb 9.6 oz (63.3 kg)  Height: '5\' 2"'$  (1.575 m)   Body mass index is 25.53 kg/m.   Physical Exam Vitals and nursing note reviewed.  Constitutional:      Appearance: Normal  appearance. She is well-developed.  HENT:     Head: Normocephalic and atraumatic.  Eyes:     Pupils: Pupils are equal, round, and reactive to light.  Cardiovascular:     Rate and Rhythm: Normal rate and regular rhythm.     Pulses: Normal pulses.     Heart sounds: Normal heart sounds.  Pulmonary:     Effort: Pulmonary effort is normal.     Breath sounds: Normal breath sounds.  Abdominal:     Palpations: Abdomen is soft.  Musculoskeletal:        General: Normal range of motion.     Cervical back: Normal range of motion and neck supple.  Lymphadenopathy:     Cervical: No cervical adenopathy.   Skin:    General: Skin is warm and dry.     Capillary Refill: Capillary refill takes less than 2 seconds.  Neurological:     General: No focal deficit present.     Mental Status: She is alert and oriented to person, place, and time.  Psychiatric:        Mood and Affect: Mood normal.        Behavior: Behavior normal.        Thought Content: Thought content normal.        Judgment: Judgment normal.    Assessment & Plan:  1. Encounter to establish care Appointment today to establish new primary care provider.  2. Screening for colon cancer Referral to GI for screening colonoscopy. - Ambulatory referral to Gastroenterology   Problem List Items Addressed This Visit   None Visit Diagnoses     Encounter to establish care    -  Primary   Screening for colon cancer       Relevant Orders   Ambulatory referral to Gastroenterology       Outpatient Encounter Medications as of 12/30/2020  Medication Sig   gabapentin (NEURONTIN) 300 MG capsule Take 300 mg by mouth 3 (three) times daily.   No facility-administered encounter medications on file as of 12/30/2020.   This note was dictated using Systems analyst. Rapid proofreading was performed to expedite the delivery of the information. Despite proofreading, phonetic errors will occur which are common with this voice recognition software. Please take this into consideration. If there are any concerns, please contact our office.    Follow-up: Return in about 4 weeks (around 01/27/2021) for health maintenance exam, FBW a week prior to visit.   Ronnell Freshwater, NP

## 2021-01-03 ENCOUNTER — Other Ambulatory Visit: Payer: Self-pay

## 2021-01-03 DIAGNOSIS — Z1211 Encounter for screening for malignant neoplasm of colon: Secondary | ICD-10-CM

## 2021-01-03 MED ORDER — PEG 3350-KCL-NA BICARB-NACL 420 G PO SOLR: 4000.0000 mL | Freq: Once | ORAL | 0 refills | Status: AC

## 2021-01-03 NOTE — Progress Notes (Signed)
Gastroenterology Pre-Procedure Review  Request Date: 02/06/21 Requesting Physician: Dr. Marius Ditch  PATIENT REVIEW QUESTIONS: The patient responded to the following health history questions as indicated:    1. Are you having any GI issues? no 2. Do you have a personal history of Polyps? no 3. Do you have a family history of Colon Cancer or Polyps? no 4. Diabetes Mellitus? no 5. Joint replacements in the past 12 months?no 6. Major health problems in the past 3 months?no 7. Any artificial heart valves, MVP, or defibrillator?no    MEDICATIONS & ALLERGIES:    Patient reports the following regarding taking any anticoagulation/antiplatelet therapy:   Plavix, Coumadin, Eliquis, Xarelto, Lovenox, Pradaxa, Brilinta, or Effient? no Aspirin? no  Patient confirms/reports the following medications:  Current Outpatient Medications  Medication Sig Dispense Refill   acetaminophen (TYLENOL) 325 MG tablet Take 650 mg by mouth 2 (two) times daily as needed (pain).     gabapentin (NEURONTIN) 300 MG capsule Take 300 mg by mouth 3 (three) times daily.     gabapentin (NEURONTIN) 300 MG capsule Take 300 mg by mouth 3 (three) times daily.     HYDROcodone-acetaminophen (NORCO/VICODIN) 5-325 MG tablet Take 2 tablets by mouth every 4 (four) hours as needed for severe pain ((score 7 to 10)). 30 tablet 0   ibuprofen (ADVIL,MOTRIN) 200 MG tablet Take 400 mg by mouth 2 (two) times daily as needed (pain).     methocarbamol (ROBAXIN) 500 MG tablet Take 1 tablet (500 mg total) by mouth every 6 (six) hours as needed for muscle spasms. 40 tablet 3   No current facility-administered medications for this visit.    Patient confirms/reports the following allergies:  No Known Allergies  No orders of the defined types were placed in this encounter.   AUTHORIZATION INFORMATION Primary Insurance: 1D#: Group #:  Secondary Insurance: 1D#: Group #:  SCHEDULE INFORMATION: Date: 02/06/21 Time: Location: [place]

## 2021-01-13 ENCOUNTER — Other Ambulatory Visit: Payer: Self-pay

## 2021-01-13 DIAGNOSIS — Z0000 Encounter for general adult medical examination without abnormal findings: Secondary | ICD-10-CM

## 2021-01-16 ENCOUNTER — Other Ambulatory Visit: Payer: 59

## 2021-01-16 ENCOUNTER — Other Ambulatory Visit: Payer: Self-pay

## 2021-01-16 DIAGNOSIS — Z0000 Encounter for general adult medical examination without abnormal findings: Secondary | ICD-10-CM

## 2021-01-23 ENCOUNTER — Other Ambulatory Visit: Payer: Self-pay

## 2021-01-23 ENCOUNTER — Other Ambulatory Visit: Payer: 59

## 2021-01-23 DIAGNOSIS — Z0000 Encounter for general adult medical examination without abnormal findings: Secondary | ICD-10-CM

## 2021-01-23 NOTE — Addendum Note (Signed)
Addended by: Vivia Birmingham on: 01/23/2021 09:00 AM   Modules accepted: Orders

## 2021-01-24 LAB — LIPID PANEL
Chol/HDL Ratio: 4.5 ratio — ABNORMAL HIGH (ref 0.0–4.4)
Cholesterol, Total: 303 mg/dL — ABNORMAL HIGH (ref 100–199)
HDL: 67 mg/dL (ref >39)
LDL Chol Calc (NIH): 222 mg/dL — ABNORMAL HIGH (ref 0–99)
Triglycerides: 87 mg/dL (ref 0–149)
VLDL Cholesterol Cal: 14 mg/dL (ref 5–40)

## 2021-01-24 LAB — TSH: TSH: 0.912 u[IU]/mL (ref 0.450–4.500)

## 2021-01-24 LAB — HEMOGLOBIN A1C
Est. average glucose Bld gHb Est-mCnc: 111 mg/dL
Hgb A1c MFr Bld: 5.5 % (ref 4.8–5.6)

## 2021-01-24 LAB — COMPREHENSIVE METABOLIC PANEL
ALT: 4 IU/L (ref 0–32)
AST: 13 IU/L (ref 0–40)
Albumin/Globulin Ratio: 2 (ref 1.2–2.2)
Albumin: 4.5 g/dL (ref 3.8–4.8)
Alkaline Phosphatase: 79 IU/L (ref 44–121)
BUN/Creatinine Ratio: 18 (ref 12–28)
BUN: 13 mg/dL (ref 8–27)
Bilirubin Total: 0.4 mg/dL (ref 0.0–1.2)
CO2: 23 mmol/L (ref 20–29)
Calcium: 10 mg/dL (ref 8.7–10.3)
Chloride: 100 mmol/L (ref 96–106)
Creatinine, Ser: 0.71 mg/dL (ref 0.57–1.00)
Globulin, Total: 2.2 g/dL (ref 1.5–4.5)
Glucose: 97 mg/dL (ref 70–99)
Potassium: 4.3 mmol/L (ref 3.5–5.2)
Sodium: 138 mmol/L (ref 134–144)
Total Protein: 6.7 g/dL (ref 6.0–8.5)
eGFR: 95 mL/min/{1.73_m2} (ref >59)

## 2021-01-24 LAB — CBC
Hematocrit: 40.6 % (ref 34.0–46.6)
Hemoglobin: 14.2 g/dL (ref 11.1–15.9)
MCH: 32.3 pg (ref 26.6–33.0)
MCHC: 35 g/dL (ref 31.5–35.7)
MCV: 92 fL (ref 79–97)
Platelets: 330 10*3/uL (ref 150–450)
RBC: 4.4 x10E6/uL (ref 3.77–5.28)
RDW: 13.1 % (ref 11.7–15.4)
WBC: 7.3 10*3/uL (ref 3.4–10.8)

## 2021-01-27 ENCOUNTER — Ambulatory Visit (INDEPENDENT_AMBULATORY_CARE_PROVIDER_SITE_OTHER): Payer: 59 | Admitting: Nurse Practitioner

## 2021-01-27 ENCOUNTER — Encounter: Payer: Self-pay | Admitting: Nurse Practitioner

## 2021-01-27 ENCOUNTER — Other Ambulatory Visit: Payer: Self-pay

## 2021-01-27 VITALS — BP 109/71 | HR 91 | Temp 97.8°F | Ht 62.0 in | Wt 142.5 lb

## 2021-01-27 DIAGNOSIS — Z0001 Encounter for general adult medical examination with abnormal findings: Secondary | ICD-10-CM

## 2021-01-27 DIAGNOSIS — E785 Hyperlipidemia, unspecified: Secondary | ICD-10-CM

## 2021-01-27 DIAGNOSIS — M4316 Spondylolisthesis, lumbar region: Secondary | ICD-10-CM

## 2021-01-27 DIAGNOSIS — Z6826 Body mass index (BMI) 26.0-26.9, adult: Secondary | ICD-10-CM | POA: Diagnosis not present

## 2021-01-27 NOTE — Progress Notes (Signed)
Established Patient Office Visit  Subjective:  Patient ID: [name], female    DOB: [birth year]  Age: 65 y.o. MRN: [id number]  CC:  Chief Complaint  Patient presents with   Annual Exam    HPI [name] presents for annual wellness visit. She has no physical concerns or complaints.  She did have routine, fasting labs just prior to this visit.  She does have a mild to moderate elevation of her LDL and total cholesterol, increasing her overall cardiovascular risk.  Lipid Panel     Component Value Date/Time   CHOL 303 (H) 01/23/2021 0940   TRIG 87 01/23/2021 0940   HDL 67 01/23/2021 0940   CHOLHDL 4.5 (H) 01/23/2021 0940   LDLCALC 222 (H) 01/23/2021 0940   LABVLDL 14 01/23/2021 0940    She would like to avoid taking medications to lower cholesterol if possible. She denies chest pain, chest pressure, or shortness of breath. She denies headaches or visual disturbances. She denies abdominal pain, nausea, vomiting, or changes in bowel or bladder habits.    History reviewed. No pertinent past medical history.  Past Surgical History:  Procedure Laterality Date   APPENDECTOMY     LUMBAR FUSION  1999   L5-S1   OVARIAN CYST REMOVAL     TUBAL LIGATION      History reviewed. No pertinent family history.  Social History   Socioeconomic History   Marital status: Married    Spouse name: Not on file   Number of children: Not on file   Years of education: Not on file   Highest education level: Not on file  Occupational History   Not on file  Tobacco Use   Smoking status: Never   Smokeless tobacco: Never  Vaping Use   Vaping Use: Never used  Substance and Sexual Activity   Alcohol use: Yes    Comment: occasionally   Drug use: Never   Sexual activity: Yes  Other Topics Concern   Not on file  Social History Narrative   ** Merged History Encounter **       Social Determinants of Health   Financial Resource Strain: Not on file  Food Insecurity: Not on file   Transportation Needs: Not on file  Physical Activity: Not on file  Stress: Not on file  Social Connections: Not on file  Intimate Partner Violence: Not on file    Outpatient Medications Prior to Visit  Medication Sig Dispense Refill   gabapentin (NEURONTIN) 300 MG capsule Take 300 mg by mouth 3 (three) times daily.     gabapentin (NEURONTIN) 300 MG capsule Take 300 mg by mouth 3 (three) times daily.     acetaminophen (TYLENOL) 325 MG tablet Take 650 mg by mouth 2 (two) times daily as needed (pain).     HYDROcodone-acetaminophen (NORCO/VICODIN) 5-325 MG tablet Take 2 tablets by mouth every 4 (four) hours as needed for severe pain ((score 7 to 10)). 30 tablet 0   ibuprofen (ADVIL,MOTRIN) 200 MG tablet Take 400 mg by mouth 2 (two) times daily as needed (pain).     methocarbamol (ROBAXIN) 500 MG tablet Take 1 tablet (500 mg total) by mouth every 6 (six) hours as needed for muscle spasms. 40 tablet 3   No facility-administered medications prior to visit.    No Known Allergies  ROS Review of Systems  Constitutional:  Negative for activity change, appetite change, chills, fatigue and fever.  HENT:  Negative for congestion, postnasal drip, rhinorrhea, sinus pressure, sinus  pain, sneezing and sore throat.   Eyes: Negative.   Respiratory:  Negative for cough, chest tightness, shortness of breath and wheezing.   Cardiovascular:  Negative for chest pain and palpitations.  Gastrointestinal:  Negative for abdominal pain, constipation, diarrhea, nausea and vomiting.  Endocrine: Negative for cold intolerance, heat intolerance, polydipsia and polyuria.  Genitourinary:  Negative for dyspareunia, dysuria, flank pain, frequency and urgency.  Musculoskeletal:  Negative for arthralgias, back pain and myalgias.  Skin:  Negative for rash.  Allergic/Immunologic: Negative for environmental allergies.  Neurological:  Negative for dizziness, weakness and headaches.  Hematological:  Negative for adenopathy.   Psychiatric/Behavioral:  The patient is not nervous/anxious.      Objective:    Physical Exam Vitals and nursing note reviewed.  Constitutional:      Appearance: Normal appearance. She is well-developed.  HENT:     Head: Normocephalic and atraumatic.     Right Ear: Tympanic membrane, ear canal and external ear normal.     Left Ear: Tympanic membrane, ear canal and external ear normal.     Nose: Nose normal.     Mouth/Throat:     Mouth: Mucous membranes are moist.  Eyes:     Extraocular Movements: Extraocular movements intact.     Conjunctiva/sclera: Conjunctivae normal.     Pupils: Pupils are equal, round, and reactive to light.  Cardiovascular:     Rate and Rhythm: Normal rate and regular rhythm.     Pulses: Normal pulses.     Heart sounds: Normal heart sounds.  Pulmonary:     Effort: Pulmonary effort is normal.     Breath sounds: Normal breath sounds.  Abdominal:     Palpations: Abdomen is soft.  Musculoskeletal:        General: Normal range of motion.     Cervical back: Normal range of motion and neck supple.  Lymphadenopathy:     Cervical: No cervical adenopathy.  Skin:    General: Skin is warm and dry.     Capillary Refill: Capillary refill takes less than 2 seconds.  Neurological:     General: No focal deficit present.     Mental Status: She is alert and oriented to person, place, and time.  Psychiatric:        Mood and Affect: Mood normal.        Behavior: Behavior normal.        Thought Content: Thought content normal.        Judgment: Judgment normal.    Today's Vitals   01/27/21 1057  BP: 109/71  Pulse: 91  Temp: 97.8 F (36.6 C)  SpO2: 99%  Weight: 142 lb 8 oz (64.6 kg)  Height: _0  (1.575 m)   Body mass index is 26.06 kg/m.   Wt Readings from Last 3 Encounters:  01/27/21 142 lb 8 oz (64.6 kg)  12/30/20 139 lb 9.6 oz (63.3 kg)  07/04/18 157 lb 12.8 oz (71.6 kg)     Health Maintenance Due  Topic Date Due   COVID-19 Vaccine (1) Never  done   HIV Screening  Never done   Hepatitis C Screening  Never done   TETANUS/TDAP  Never done   PAP SMEAR-Modifier  Never done   MAMMOGRAM  Never done   Zoster Vaccines- Shingrix (1 of 2) Never done   INFLUENZA VACCINE  Never done    There are no preventive care reminders to display for this patient.  Lab Results  Component Value Date   TSH 0.912  01/23/2021   Lab Results  Component Value Date   WBC 7.3 01/23/2021   HGB 14.2 01/23/2021   HCT 40.6 01/23/2021   MCV 92 01/23/2021   PLT 330 01/23/2021   Lab Results  Component Value Date   NA 138 01/23/2021   K 4.3 01/23/2021   CO2 23 01/23/2021   GLUCOSE 97 01/23/2021   BUN 13 01/23/2021   CREATININE 0.71 01/23/2021   BILITOT 0.4 01/23/2021   ALKPHOS 79 01/23/2021   AST 13 01/23/2021   ALT 4 01/23/2021   PROT 6.7 01/23/2021   ALBUMIN 4.5 01/23/2021   CALCIUM 10.0 01/23/2021   ANIONGAP 13 07/04/2018   EGFR 95 01/23/2021   Lab Results  Component Value Date   CHOL 303 (H) 01/23/2021   Lab Results  Component Value Date   HDL 67 01/23/2021   Lab Results  Component Value Date   LDLCALC 222 (H) 01/23/2021   Lab Results  Component Value Date   TRIG 87 01/23/2021   Lab Results  Component Value Date   CHOLHDL 4.5 (H) 01/23/2021   Lab Results  Component Value Date   HGBA1C 5.5 01/23/2021      Assessment & Plan:  1. Encounter for general adult medical examination with abnormal findings Annual wellness visit today.  2. Hyperlipidemia LDL goal <100 Reviewed recent fasting labs.  She does have moderate elevation of LDL and total cholesterol.  Discussed limiting intake of fried and fatty foods to help lower cholesterol.  She should also incorporate exercise into her daily routine.  We will recheck lipid panel in 1 year.  If no improvement or worsening panel, will start lipid-lowering medication.  In the meantime also encouraged her to add fish oil daily to routine medication.  3. Spondylolisthesis of lumbar  region Patient should continue to see neurosurgeon on routine basis for surveillance.  4. Body mass index 26.0-26.9, adult Encourage patient to limit calorie intake to 1500 cal/day.  He should consume a low-fat, low-cholesterol diet.  He should incorporate exercise into her daily routine.   Problem List Items Addressed This Visit       Musculoskeletal and Integument   Spondylolisthesis of lumbar region     Other   Encounter for general adult medical examination with abnormal findings - Primary   Hyperlipidemia LDL goal <100   Body mass index 26.0-26.9, adult   This note was dictated using Dragon Voice Recognition Software. Rapid proofreading was performed to expedite the delivery of the information. Despite proofreading, phonetic errors will occur which are common with this voice recognition software. Please take this into consideration. If there are any concerns, please contact our office.     Follow-up: Return in about 1 year (around 01/27/2022) for health maintenance exam, FBW a week prior to visit.    Ronnell Freshwater, NP

## 2021-01-27 NOTE — Progress Notes (Signed)
Reviewed with patient during visit 01/27/2021

## 2021-02-03 ENCOUNTER — Encounter: Payer: Self-pay | Admitting: Gastroenterology

## 2021-02-05 DIAGNOSIS — Z0001 Encounter for general adult medical examination with abnormal findings: Secondary | ICD-10-CM | POA: Insufficient documentation

## 2021-02-05 DIAGNOSIS — E785 Hyperlipidemia, unspecified: Secondary | ICD-10-CM | POA: Insufficient documentation

## 2021-02-05 DIAGNOSIS — Z6826 Body mass index (BMI) 26.0-26.9, adult: Secondary | ICD-10-CM | POA: Insufficient documentation

## 2021-02-05 NOTE — Patient Instructions (Signed)

## 2021-02-06 ENCOUNTER — Encounter
Admission: RE | Disposition: A | Discharge status: Home / Self Care | Payer: Self-pay | Source: Home / Self Care | Attending: Gastroenterology

## 2021-02-06 ENCOUNTER — Ambulatory Visit: Payer: 59 | Admitting: Anesthesiology

## 2021-02-06 ENCOUNTER — Ambulatory Visit
Admission: RE | Admit: 2021-02-06 | Discharge: 2021-02-06 | Disposition: A | Discharge status: Home / Self Care | Payer: 59 | Attending: Gastroenterology | Admitting: Gastroenterology

## 2021-02-06 ENCOUNTER — Other Ambulatory Visit: Payer: Self-pay

## 2021-02-06 ENCOUNTER — Encounter: Payer: Self-pay | Admitting: Gastroenterology

## 2021-02-06 DIAGNOSIS — K635 Polyp of colon: Secondary | ICD-10-CM

## 2021-02-06 DIAGNOSIS — D128 Benign neoplasm of rectum: Secondary | ICD-10-CM | POA: Insufficient documentation

## 2021-02-06 DIAGNOSIS — Z87891 Personal history of nicotine dependence: Secondary | ICD-10-CM | POA: Diagnosis not present

## 2021-02-06 DIAGNOSIS — D124 Benign neoplasm of descending colon: Secondary | ICD-10-CM | POA: Diagnosis not present

## 2021-02-06 DIAGNOSIS — K644 Residual hemorrhoidal skin tags: Secondary | ICD-10-CM | POA: Insufficient documentation

## 2021-02-06 DIAGNOSIS — D123 Benign neoplasm of transverse colon: Secondary | ICD-10-CM | POA: Diagnosis not present

## 2021-02-06 DIAGNOSIS — Z1211 Encounter for screening for malignant neoplasm of colon: Secondary | ICD-10-CM

## 2021-02-06 DIAGNOSIS — D127 Benign neoplasm of rectosigmoid junction: Secondary | ICD-10-CM | POA: Insufficient documentation

## 2021-02-06 DIAGNOSIS — Z79899 Other long term (current) drug therapy: Secondary | ICD-10-CM | POA: Insufficient documentation

## 2021-02-06 DIAGNOSIS — D125 Benign neoplasm of sigmoid colon: Secondary | ICD-10-CM | POA: Diagnosis not present

## 2021-02-06 DIAGNOSIS — K6389 Other specified diseases of intestine: Secondary | ICD-10-CM | POA: Diagnosis not present

## 2021-02-06 DIAGNOSIS — Z9049 Acquired absence of other specified parts of digestive tract: Secondary | ICD-10-CM | POA: Diagnosis not present

## 2021-02-06 DIAGNOSIS — D120 Benign neoplasm of cecum: Secondary | ICD-10-CM | POA: Insufficient documentation

## 2021-02-06 DIAGNOSIS — D122 Benign neoplasm of ascending colon: Secondary | ICD-10-CM | POA: Insufficient documentation

## 2021-02-06 HISTORY — DX: Dorsalgia, unspecified: M54.9

## 2021-02-06 HISTORY — PX: COLONOSCOPY WITH PROPOFOL: SHX5780

## 2021-02-06 SURGERY — COLONOSCOPY WITH PROPOFOL
Anesthesia: General

## 2021-02-06 MED ORDER — SODIUM CHLORIDE (PF) 0.9 % IJ SOLN
INTRAMUSCULAR | Status: DC | PRN
  Administered 2021-02-06: 5 mL via INTRAVENOUS

## 2021-02-06 MED ORDER — DEXMEDETOMIDINE (PRECEDEX) IN NS 20 MCG/5ML (4 MCG/ML) IV SYRINGE
PREFILLED_SYRINGE | INTRAVENOUS | Status: DC | PRN
  Administered 2021-02-06: 8 ug via INTRAVENOUS
  Administered 2021-02-06: 12 ug via INTRAVENOUS

## 2021-02-06 MED ORDER — PROPOFOL 10 MG/ML IV BOLUS
INTRAVENOUS | Status: AC
  Filled 2021-02-06: qty 20

## 2021-02-06 MED ORDER — PROPOFOL 500 MG/50ML IV EMUL
INTRAVENOUS | Status: DC | PRN
  Administered 2021-02-06: 150 ug/kg/min via INTRAVENOUS

## 2021-02-06 MED ORDER — LIDOCAINE HCL (PF) 2 % IJ SOLN
INTRAMUSCULAR | Status: AC
  Filled 2021-02-06: qty 5

## 2021-02-06 MED ORDER — SODIUM CHLORIDE 0.9 % IV SOLN: INTRAVENOUS | Status: DC

## 2021-02-06 MED ORDER — PROPOFOL 500 MG/50ML IV EMUL
INTRAVENOUS | Status: AC
  Filled 2021-02-06: qty 50

## 2021-02-06 MED ORDER — LIDOCAINE 2% (20 MG/ML) 5 ML SYRINGE
INTRAMUSCULAR | Status: DC | PRN
  Administered 2021-02-06: 50 mg via INTRAVENOUS

## 2021-02-06 MED ORDER — PROPOFOL 10 MG/ML IV BOLUS
INTRAVENOUS | Status: DC | PRN
  Administered 2021-02-06 (×2): 50 mg via INTRAVENOUS
  Administered 2021-02-06: 60 mg via INTRAVENOUS
  Administered 2021-02-06: 40 mg via INTRAVENOUS

## 2021-02-06 NOTE — Transfer of Care (Signed)
Immediate Anesthesia Transfer of Care Note  Patient: [name]  Procedure(s) Performed: COLONOSCOPY WITH PROPOFOL  Patient Location: Endoscopy Unit  Anesthesia Type:General  Level of Consciousness: awake  Airway & Oxygen Therapy: Patient Spontanous Breathing  Post-op Assessment: Report given to RN and Post -op Vital signs reviewed and stable  Post vital signs: Reviewed and stable  Last Vitals:  Vitals Value Taken Time  BP 120/79   Temp    Pulse    Resp 14   SpO2 98     Last Pain:  Vitals:   02/06/21 0947  TempSrc: Temporal  PainSc: 0-No pain         Complications: No notable events documented.

## 2021-02-06 NOTE — Anesthesia Preprocedure Evaluation (Signed)
Anesthesia Evaluation  Patient identified by MRN, date of birth, ID band Patient awake    Reviewed: Allergy & Precautions, NPO status , Patient's Chart, lab work & pertinent test results  Airway Mallampati: II  TM Distance: >3 FB Neck ROM: Full    Dental  (+) Teeth Intact   Pulmonary former smoker,    breath sounds clear to auscultation       Cardiovascular negative cardio ROS   Rhythm:Regular Rate:Normal     Neuro/Psych negative neurological ROS  negative psych ROS   GI/Hepatic negative GI ROS, Neg liver ROS, Bowel prep,  Endo/Other  negative endocrine ROS  Renal/GU negative Renal ROS     Musculoskeletal negative musculoskeletal ROS (+)   Abdominal Normal abdominal exam  (+)   Peds  Hematology negative hematology ROS (+)   Anesthesia Other Findings   Reproductive/Obstetrics                             Anesthesia Physical  Anesthesia Plan  ASA: 2  Anesthesia Plan: General   Post-op Pain Management:    Induction: Intravenous  PONV Risk Score and Plan: 4 or greater and 2 and Propofol infusion and TIVA  Airway Management Planned: Natural Airway and Nasal Cannula  Additional Equipment: None  Intra-op Plan:   Post-operative Plan:   Informed Consent: I have reviewed the patients History and Physical, chart, labs and discussed the procedure including the risks, benefits and alternatives for the proposed anesthesia with the patient or authorized representative who has indicated his/her understanding and acceptance.       Plan Discussed with: CRNA, Anesthesiologist and Surgeon  Anesthesia Plan Comments:         Anesthesia Quick Evaluation

## 2021-02-06 NOTE — H&P (Signed)
  Cephas Darby, MD [address]  [place]  [place], [place] [address]  Main: [phone]  Fax: [phone] Pager: [phone]  Primary Care Physician:  Ronnell Freshwater, NP Primary Gastroenterologist:  Dr. Cephas Darby  Pre-Procedure History & Physical: HPI:  [name] is a 65 y.o. female is here for an colonoscopy.   Past Medical History:  Diagnosis Date   Back pain     Past Surgical History:  Procedure Laterality Date   APPENDECTOMY     LUMBAR FUSION  1999   L5-S1   OVARIAN CYST REMOVAL     TUBAL LIGATION      Prior to Admission medications   Medication Sig Start Date End Date Taking? Authorizing Provider  gabapentin (NEURONTIN) 300 MG capsule Take 300 mg by mouth 3 (three) times daily. 05/21/18  Yes [provider]  gabapentin (NEURONTIN) 300 MG capsule Take 300 mg by mouth 3 (three) times daily.   Yes [provider]    Allergies as of 01/03/2021   (No Known Allergies)    History reviewed. No pertinent family history.  Social History   Socioeconomic History   Marital status: Married    Spouse name: Not on file   Number of children: Not on file   Years of education: Not on file   Highest education level: Not on file  Occupational History   Not on file  Tobacco Use   Smoking status: Former    Types: Cigarettes    Quit date: 2012    Years since quitting: 10.8   Smokeless tobacco: Never  Vaping Use   Vaping Use: Never used  Substance and Sexual Activity   Alcohol use: Yes    Comment: occasionally   Drug use: Never   Sexual activity: Yes  Other Topics Concern   Not on file  Social History Narrative   ** Merged History Encounter **       Social Determinants of Health   Financial Resource Strain: Not on file  Food Insecurity: Not on file  Transportation Needs: Not on file  Physical Activity: Not on file  Stress: Not on file  Social Connections: Not on file  Intimate Partner Violence: Not on file    Review  of Systems: See HPI, otherwise negative ROS  Physical Exam: BP 132/79   Pulse 81   Temp 98.7 F (37.1 C) (Temporal)   Resp 16   Ht 5\' 2"  (1.575 m)   Wt 63.5 kg   LMP  (LMP Unknown)   SpO2 100%   BMI 25.61 kg/m  General:   Alert,  pleasant and cooperative in NAD Head:  Normocephalic and atraumatic. Neck:  Supple; no masses or thyromegaly. Lungs:  Clear throughout to auscultation.    Heart:  Regular rate and rhythm. Abdomen:  Soft, nontender and nondistended. Normal bowel sounds, without guarding, and without rebound.   Neurologic:  Alert and  oriented x4;  grossly normal neurologically.  Impression/Plan: [name] is here for an colonoscopy to be performed for colon cancer screening  Risks, benefits, limitations, and alternatives regarding  colonoscopy have been reviewed with the patient.  Questions have been answered.  All parties agreeable.   Sherri Sear, MD  02/06/2021, 9:50 AM

## 2021-02-06 NOTE — Anesthesia Postprocedure Evaluation (Signed)
Anesthesia Post Note  Patient: [name]  Procedure(s) Performed: COLONOSCOPY WITH PROPOFOL  Patient location during evaluation: Phase II Anesthesia Type: General Level of consciousness: awake and alert, awake and oriented Pain management: pain level controlled Vital Signs Assessment: post-procedure vital signs reviewed and stable Respiratory status: spontaneous breathing, nonlabored ventilation and respiratory function stable Cardiovascular status: blood pressure returned to baseline and stable Postop Assessment: no apparent nausea or vomiting Anesthetic complications: no   No notable events documented.   Last Vitals:  Vitals:   02/06/21 1231 02/06/21 1241  BP: (!) 115/100 124/79  Pulse: 61 62  Resp: 17 16  Temp:    SpO2: 99% 99%    Last Pain:  Vitals:   02/06/21 1241  TempSrc:   PainSc: 0-No pain                 Phill Mutter

## 2021-02-06 NOTE — Op Note (Signed)
[facility] Patient Name: [name] Procedure Date: 02/06/2021 10:22 AM MRN: [id number] Account #: [id number] Date of Birth: Nov 20, 1955 Admit Type: Outpatient Age: 65 Room: [facility] ENDO ROOM 1 Gender: Female Note Status: Finalized Instrument Name: Jasper Riling [id number] Procedure:             Colonoscopy Indications:           Screening for colorectal malignant neoplasm, This is                         the patient's first colonoscopy Providers:             Lin Landsman MD, MD Medicines:             General Anesthesia Complications:         No immediate complications. Estimated blood loss:                         Minimal. Procedure:             Pre-Anesthesia Assessment:                        - Prior to the procedure, a History and Physical was                         performed, and patient medications and allergies were                         reviewed. The patient is competent. The risks and                         benefits of the procedure and the sedation options and                         risks were discussed with the patient. All questions                         were answered and informed consent was obtained.                         Patient identification and proposed procedure were                         verified by the physician, the nurse, the                         anesthesiologist, the anesthetist and the technician                         in the pre-procedure area in the procedure room in the                         endoscopy suite. Mental Status Examination: alert and                         oriented. Airway Examination: normal oropharyngeal                         airway and neck mobility. Respiratory Examination:  clear to auscultation. CV Examination: normal.                         Prophylactic Antibiotics: The patient does not require                         prophylactic antibiotics. Prior  Anticoagulants: The                         patient has taken no previous anticoagulant or                         antiplatelet agents. ASA Grade Assessment: II - A                         patient with mild systemic disease. After reviewing                         the risks and benefits, the patient was deemed in                         satisfactory condition to undergo the procedure. The                         anesthesia plan was to use general anesthesia.                         Immediately prior to administration of medications,                         the patient was re-assessed for adequacy to receive                         sedatives. The heart rate, respiratory rate, oxygen                         saturations, blood pressure, adequacy of pulmonary                         ventilation, and response to care were monitored                         throughout the procedure. The physical status of the                         patient was re-assessed after the procedure.                        After obtaining informed consent, the colonoscope was                         passed under direct vision. Throughout the procedure,                         the patient's blood pressure, pulse, and oxygen                         saturations were monitored continuously. The  Colonoscope was introduced through the anus and                         advanced to the the cecum, identified by appendiceal                         orifice and ileocecal valve. The colonoscopy was                         performed without difficulty. The patient tolerated                         the procedure well. The quality of the bowel                         preparation was adequate to identify polyps 6 mm and                         larger in size. Findings:      The perianal and digital rectal examinations were normal. Pertinent       negatives include normal sphincter tone and no palpable rectal  lesions.      Two sessile polyps were found in the ascending colon. The polyps were 5       to 7 mm in size. These polyps were removed with a cold snare. Resection       and retrieval were complete.      A 5 mm polyp was found in the cecum. The polyp was sessile. The polyp       was removed with a cold snare. Resection and retrieval were complete.      A 10 mm polyp was found in the transverse colon. The polyp was sessile.       Preparations were made for mucosal resection. Saline was injected to       raise the lesion. Snare mucosal resection was performed. Resection and       retrieval were complete.      A 15 mm polyp was found in the transverse colon. The polyp was sessile.       Preparations were made for mucosal resection. Chromoscopy with methylene       blue was done to mark the borders of the lesion. Eleview was injected to       raise the lesion. Piecemeal mucosal resection using a snare was       performed. Resection and retrieval were complete.      Seven sessile polyps were found in the descending colon. The polyps were       5 to 7 mm in size. These polyps were removed with a cold snare.       Resection and retrieval were complete.      A 15 mm polyp was found in the descending colon. The polyp was flat.       Preparations were made for mucosal resection. Eleview was injected to       raise the lesion. Snare mucosal resection was performed. Resection and       retrieval were complete. To prevent bleeding after mucosal resection,       three hemostatic clips were successfully placed (MR conditional). There       was no bleeding at the end of the procedure.  Four sessile polyps were found in the sigmoid colon. The polyps were 4       to 6 mm in size. These polyps were removed with a cold snare. Resection       and retrieval were complete.      Three sessile polyps were found in the recto-sigmoid colon. The polyps       were 4 to 6 mm in size. These polyps were removed with  a cold snare.       Resection and retrieval were complete.      Two sessile polyps were found in the rectum. The polyps were 4 to 5 mm       in size. These polyps were removed with a cold snare. Resection and       retrieval were complete.      A 20 mm polyp was found in the rectum. The polyp was flat. Preparations       were made for mucosal resection. Eleview was injected with adequate lift       of the lesion from the muscularis propria. Snare mucosal resection was       performed. A 15 mm area was resected. Resection and retrieval were       complete. There was no bleeding during and at the end of the procedure.       To prevent bleeding after mucosal resection, five hemostatic clips were       successfully placed (MR conditional). There was no bleeding during, or       at the end, of the procedure.      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      Non-bleeding external hemorrhoids were found during retroflexion. The       hemorrhoids were medium-sized. Impression:            - Two 5 to 7 mm polyps in the ascending colon, removed                         with a cold snare. Resected and retrieved.                        - One 5 mm polyp in the cecum, removed with a cold                         snare. Resected and retrieved.                        - One 10 mm polyp in the transverse colon, removed                         with mucosal resection. Resected and retrieved.                        - One 15 mm polyp in the transverse colon, removed                         with mucosal resection. Resected and retrieved.                        - Seven 5 to 7 mm polyps in the descending colon,  removed with a cold snare. Resected and retrieved.                        - One 15 mm polyp in the descending colon, removed                         with mucosal resection. Resected and retrieved. Clips                         (MR conditional) were placed.                         - Four 4 to 6 mm polyps in the sigmoid colon, removed                         with a cold snare. Resected and retrieved.                        - Three 4 to 6 mm polyps at the recto-sigmoid colon,                         removed with a cold snare. Resected and retrieved.                        - Two 4 to 5 mm polyps in the rectum, removed with a                         cold snare. Resected and retrieved.                        - One 20 mm polyp in the rectum, removed with mucosal                         resection. Resected and retrieved. Clips (MR                         conditional) were placed.                        - Melanosis in the colon.                        - Non-bleeding external hemorrhoids.                        - Mucosal resection was performed. Resection and                         retrieval were complete.                        - Mucosal resection was performed. Resection and                         retrieval were complete.                        - Mucosal resection was performed. Resection and  retrieval were complete.                        - Mucosal resection was performed. Resection and                         retrieval were complete. Recommendation:        - Discharge patient to home (with escort).                        - Resume previous diet today.                        - Continue present medications.                        - Await pathology results.                        - Repeat colonoscopy in 1 year for surveillance of                         multiple polyps.                        - Refer to a genetics counselor at appointment to be                         scheduled. Procedure Code(s):     --- Professional ---                        [phone], Colonoscopy, flexible; with endoscopic mucosal                         resection                        [id number], 71, Colonoscopy, flexible; with removal of                          tumor(s), polyp(s), or other lesion(s) by snare                         technique Diagnosis Code(s):     --- Professional ---                        K63.5, Polyp of colon                        K62.1, Rectal polyp                        Z12.11, Encounter for screening for malignant neoplasm                         of colon                        K63.89, Other specified diseases of intestine                        K64.4, Residual hemorrhoidal skin tags CPT copyright  2019 [employer]. All rights reserved. The codes documented in this report are preliminary and upon coder review may  be revised to meet current compliance requirements. Dr. Ulyess Mort Lin Landsman MD, MD 02/06/2021 12:14:45 PM This report has been signed electronically. Number of Addenda: 0 Note Initiated On: 02/06/2021 10:22 AM Scope Withdrawal Time: 1 hour 19 minutes 3 seconds  Total Procedure Duration: 1 hour 24 minutes 17 seconds  Estimated Blood Loss:  Estimated blood loss was minimal.      [facility]

## 2021-02-07 ENCOUNTER — Encounter: Payer: Self-pay | Admitting: Gastroenterology

## 2021-02-07 LAB — SURGICAL PATHOLOGY

## 2021-02-08 ENCOUNTER — Other Ambulatory Visit: Payer: Self-pay

## 2021-02-08 ENCOUNTER — Telehealth: Payer: Self-pay

## 2021-02-08 NOTE — Telephone Encounter (Signed)
Called alternate number in patient chart left message for patient to call me back

## 2021-02-08 NOTE — Telephone Encounter (Signed)
Called patient no answer left message for a call back

## 2021-02-09 ENCOUNTER — Telehealth: Payer: Self-pay

## 2021-02-09 NOTE — Telephone Encounter (Signed)
Called patient she does not want to do referral to genetic counselor  and will follow back up with [place] in a few months to schedule the colonoscopy wanted in six months to remove the rest of polyps

## 2021-02-09 NOTE — Telephone Encounter (Signed)
-----   Message from Lin Landsman, MD sent at 02/07/2021  2:09 PM EDT ----- Please inform patient that all the polyps that I removed came back as benign.  Recommend referral to genetic counselor at cancer center Recommend colonoscopy with 2-day prep in 6 months to remove rest of the polyps that were not removed  [name]

## 2023-10-14 ENCOUNTER — Other Ambulatory Visit: Payer: Self-pay | Admitting: Neurological Surgery

## 2023-10-14 DIAGNOSIS — M48061 Spinal stenosis, lumbar region without neurogenic claudication: Secondary | ICD-10-CM

## 2023-11-18 ENCOUNTER — Ambulatory Visit
Admission: RE | Admit: 2023-11-18 | Discharge: 2023-11-18 | Disposition: A | Discharge status: Home / Self Care | Source: Ambulatory Visit | Attending: Neurological Surgery | Admitting: Neurological Surgery

## 2023-11-18 DIAGNOSIS — M48061 Spinal stenosis, lumbar region without neurogenic claudication: Secondary | ICD-10-CM

## 2023-11-18 MED ORDER — GADOPICLENOL 0.5 MMOL/ML IV SOLN
6.0000 mL | Freq: Once | INTRAVENOUS | Status: AC | PRN
  Administered 2023-11-18: 6 mL via INTRAVENOUS
